# Patient Record
Sex: Male | Born: 1990 | Race: White | Hispanic: No | Marital: Single | Smoking: Current every day smoker
Health system: Southern US, Community
[De-identification: ages and names within clinical notes are randomized; demographics above are authoritative.]

## PROBLEM LIST (undated history)

## (undated) DIAGNOSIS — Z789 Other specified health status: Secondary | ICD-10-CM

## (undated) HISTORY — PX: TYMPANOSTOMY TUBE PLACEMENT: SHX32

---
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
## 2011-05-29 ENCOUNTER — Encounter: Payer: Self-pay | Admitting: Family Medicine

## 2011-05-29 ENCOUNTER — Ambulatory Visit (INDEPENDENT_AMBULATORY_CARE_PROVIDER_SITE_OTHER): Payer: Self-pay | Admitting: Family Medicine

## 2011-05-29 VITALS — BP 140/100 | HR 60 | Wt 145.0 lb

## 2011-05-29 DIAGNOSIS — N342 Other urethritis: Secondary | ICD-10-CM

## 2011-05-29 MED ORDER — DOXYCYCLINE HYCLATE 100 MG PO TABS: 100.0000 mg | ORAL_TABLET | Freq: Two times a day (BID) | ORAL | Status: AC

## 2011-05-29 NOTE — Progress Notes (Signed)
  Subjective:    Patient ID: Curtis Ross, male    DOB: 1991/01/04, 21 y.o.   MRN: [id number]  HPI He complains of a weeklong history of difficulty with dysuria but no discharge.. Recently his girlfriend was diagnosed with Chlamydia.   Review of Systems     Objective:   Physical Exam Alert and in no distress otherwise not examined       Assessment & Plan:   1. Urethritis  GC/chlamydia probe amp, urine, doxycycline (VIBRA-TABS) 100 MG tablet   condoms were given. I will call with the results

## 2011-05-31 LAB — GC/CHLAMYDIA PROBE AMP, URINE
Chlamydia, Swab/Urine, PCR: POSITIVE — AB
GC Probe Amp, Urine: NEGATIVE

## 2011-06-14 ENCOUNTER — Emergency Department (HOSPITAL_COMMUNITY)
Admission: EM | Admit: 2011-06-14 | Discharge: 2011-06-14 | Disposition: A | Discharge status: Home / Self Care | Payer: Self-pay | Attending: Emergency Medicine | Admitting: Emergency Medicine

## 2011-06-14 ENCOUNTER — Encounter (HOSPITAL_COMMUNITY): Payer: Self-pay | Admitting: Emergency Medicine

## 2011-06-14 DIAGNOSIS — S0100XA Unspecified open wound of scalp, initial encounter: Secondary | ICD-10-CM | POA: Insufficient documentation

## 2011-06-14 DIAGNOSIS — F10929 Alcohol use, unspecified with intoxication, unspecified: Secondary | ICD-10-CM

## 2011-06-14 DIAGNOSIS — W010XXA Fall on same level from slipping, tripping and stumbling without subsequent striking against object, initial encounter: Secondary | ICD-10-CM | POA: Insufficient documentation

## 2011-06-14 DIAGNOSIS — S0101XA Laceration without foreign body of scalp, initial encounter: Secondary | ICD-10-CM

## 2011-06-14 DIAGNOSIS — F17200 Nicotine dependence, unspecified, uncomplicated: Secondary | ICD-10-CM | POA: Insufficient documentation

## 2011-06-14 DIAGNOSIS — F1010 Alcohol abuse, uncomplicated: Secondary | ICD-10-CM | POA: Insufficient documentation

## 2011-06-14 MED ORDER — TETANUS-DIPHTH-ACELL PERTUSSIS 5-2.5-18.5 LF-MCG/0.5 IM SUSP
0.5000 mL | Freq: Once | INTRAMUSCULAR | Status: AC
  Administered 2011-06-14: 0.5 mL via INTRAMUSCULAR

## 2011-06-14 MED ORDER — TETANUS-DIPHTH-ACELL PERTUSSIS 5-2.5-18.5 LF-MCG/0.5 IM SUSP
INTRAMUSCULAR | Status: AC
  Administered 2011-06-14: 0.5 mL via INTRAMUSCULAR
  Filled 2011-06-14: qty 0.5

## 2011-06-14 NOTE — ED Provider Notes (Signed)
History     CSN: [id number]  Arrival date & time 06/14/11  0122   First MD Initiated Contact with Patient 06/14/11 0129      Chief Complaint  Patient presents with  . Fall    (Consider location/radiation/quality/duration/timing/severity/associated sxs/prior treatment) Patient is a 21 y.o. male presenting with fall. The history is provided by the patient and a parent.  Fall Pertinent negatives include no nausea, no vomiting and no headaches.   the patient is a 21 year old, male, who was drinking heavily tonight.  He fell and hit the back of his head and has a laceration to the back of his head.  He denies vomiting.  He denies a headache.  He denies any other injuries.  His last tetanus shot was within 5 years.  History reviewed. No pertinent past medical history.  History reviewed. No pertinent past surgical history.  History reviewed. No pertinent family history.  History  Substance Use Topics  . Smoking status: Current Everyday Smoker -- 1.0 packs/day  . Smokeless tobacco: Never Used  . Alcohol Use: Yes      Review of Systems  HENT: Negative for nosebleeds and neck pain.   Eyes: Negative for visual disturbance.  Gastrointestinal: Negative for nausea and vomiting.  Skin: Positive for wound.       Left occipital scalp laceration  Neurological: Negative for headaches.  Psychiatric/Behavioral: Negative for confusion.  All other systems reviewed and are negative.    Allergies  Review of patient's allergies indicates no known allergies.  Home Medications  No current outpatient prescriptions on file.  BP 148/88  Pulse 138  Temp(Src) 98.3 F (36.8 C) (Oral)  Resp 24  SpO2 97%  Physical Exam  Vitals reviewed. Constitutional: He is oriented to person, place, and time. He appears well-developed and well-nourished. No distress.       Obviously intoxicated  HENT:  Head: Normocephalic.       3 cm left occipital laceration, with active bleeding  Eyes: Conjunctivae  are normal. Pupils are equal, round, and reactive to light.  Neck: Normal range of motion. Neck supple.  Pulmonary/Chest: Effort normal.  Abdominal: He exhibits no distension.  Musculoskeletal: Normal range of motion. He exhibits no edema.  Neurological: He is alert and oriented to person, place, and time. No cranial nerve deficit.  Skin: Skin is warm and dry.  Psychiatric: He has a normal mood and affect. Thought content normal.    ED Course  Procedures (including critical care time) 21 year old, male, intoxicated with alcohol, presents with occipital laceration.  Although he is drunk.  He does not have any confused, speech or neurological deficits.  I do not think he has injured his neck or brain.  We will update his tetanus shot and close his wound with staples.  His mother is a Engineer, civil (consulting), and both she and her husband are comfortable taking the patient home without performing a CAT scan of his head.  Labs Reviewed - No data to display No results found.   1. Scalp laceration   2. Alcohol intoxication       MDM  Scalp laceration Alcohol intoxication Normal.  Mental status, however, and normal.  Neurological examination.  I doubt that the patient has a brain injury or neck injury.        Cheri Guppy, MD 06/14/11 [phone]
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
## 2011-06-14 NOTE — ED Notes (Signed)
Per pt's mother, pt has history of ETOH, marijuana and cocaine abuse, she is unsure if still using drugs, but pt is supposed to be in outpatient program

## 2011-06-14 NOTE — ED Notes (Signed)
Pt here with etoh on board, has lac to back of head and multiple abrasions noted to back from fall this am while with friends.

## 2011-06-14 NOTE — Discharge Instructions (Signed)
Do not drink excessive amounts of alcohol.  Followup with your Dr. in 5 days for staple removal.  Return for confusion.  Persistent vomiting or uncontrolled pain.

## 2012-04-26 ENCOUNTER — Ambulatory Visit (HOSPITAL_COMMUNITY)
Admission: RE | Admit: 2012-04-26 | Discharge: 2012-04-26 | Disposition: A | Discharge status: Home / Self Care | Payer: Self-pay | Attending: Internal Medicine | Admitting: Internal Medicine

## 2012-04-26 ENCOUNTER — Emergency Department (HOSPITAL_COMMUNITY)
Admission: EM | Admit: 2012-04-26 | Discharge: 2012-04-26 | Payer: Self-pay | Attending: Emergency Medicine | Admitting: Emergency Medicine

## 2012-04-26 ENCOUNTER — Encounter (HOSPITAL_COMMUNITY): Payer: Self-pay | Admitting: Emergency Medicine

## 2012-04-26 DIAGNOSIS — F1910 Other psychoactive substance abuse, uncomplicated: Secondary | ICD-10-CM | POA: Insufficient documentation

## 2012-04-26 DIAGNOSIS — F17200 Nicotine dependence, unspecified, uncomplicated: Secondary | ICD-10-CM | POA: Insufficient documentation

## 2012-04-26 DIAGNOSIS — F1210 Cannabis abuse, uncomplicated: Secondary | ICD-10-CM | POA: Insufficient documentation

## 2012-04-26 DIAGNOSIS — F1410 Cocaine abuse, uncomplicated: Secondary | ICD-10-CM | POA: Insufficient documentation

## 2012-04-26 DIAGNOSIS — F1010 Alcohol abuse, uncomplicated: Secondary | ICD-10-CM | POA: Insufficient documentation

## 2012-04-26 LAB — COMPREHENSIVE METABOLIC PANEL
ALT: 18 U/L (ref 0–53)
AST: 38 U/L — ABNORMAL HIGH (ref 0–37)
Albumin: 4.6 g/dL (ref 3.5–5.2)
Alkaline Phosphatase: 67 U/L (ref 39–117)
BUN: 10 mg/dL (ref 6–23)
CO2: 27 mEq/L (ref 19–32)
Calcium: 9.1 mg/dL (ref 8.4–10.5)
Chloride: 102 mEq/L (ref 96–112)
Creatinine, Ser: 0.85 mg/dL (ref 0.50–1.35)
GFR calc Af Amer: >90 mL/min (ref >90)
GFR calc non Af Amer: >90 mL/min (ref >90)
Glucose, Bld: 109 mg/dL — ABNORMAL HIGH (ref 70–99)
Potassium: 4 mEq/L (ref 3.5–5.1)
Sodium: 141 mEq/L (ref 135–145)
Total Bilirubin: 0.5 mg/dL (ref 0.3–1.2)
Total Protein: 8 g/dL (ref 6.0–8.3)

## 2012-04-26 LAB — RAPID URINE DRUG SCREEN, HOSP PERFORMED
Amphetamines: NOT DETECTED
Barbiturates: NOT DETECTED
Benzodiazepines: NOT DETECTED
Cocaine: POSITIVE — AB
Opiates: NOT DETECTED
Tetrahydrocannabinol: POSITIVE — AB

## 2012-04-26 LAB — CBC
MCH: 32.6 pg (ref 26.0–34.0)
MCV: 91.1 fL (ref 78.0–100.0)
Platelets: 249 10*3/uL (ref 150–400)
RBC: 4.94 MIL/uL (ref 4.22–5.81)
RDW: 11.9 % (ref 11.5–15.5)

## 2012-04-26 LAB — SALICYLATE LEVEL: Salicylate Lvl: <2 mg/dL — ABNORMAL LOW (ref 2.8–20.0)

## 2012-04-26 MED ORDER — LORAZEPAM 1 MG PO TABS: 1.0000 mg | ORAL_TABLET | Freq: Three times a day (TID) | ORAL | Status: DC | PRN

## 2012-04-26 MED ORDER — LORAZEPAM 1 MG PO TABS: 0.0000 mg | ORAL_TABLET | Freq: Two times a day (BID) | ORAL | Status: DC

## 2012-04-26 MED ORDER — THIAMINE HCL 100 MG/ML IJ SOLN
100.0000 mg | Freq: Every day | INTRAMUSCULAR | Status: DC
Start: 2012-04-26 — End: 2012-04-26

## 2012-04-26 MED ORDER — NICOTINE 21 MG/24HR TD PT24
21.0000 mg | MEDICATED_PATCH | Freq: Every day | TRANSDERMAL | Status: DC
  Filled 2012-04-26: qty 1

## 2012-04-26 MED ORDER — ONDANSETRON HCL 4 MG PO TABS: 4.0000 mg | ORAL_TABLET | Freq: Three times a day (TID) | ORAL | Status: DC | PRN

## 2012-04-26 MED ORDER — IBUPROFEN 600 MG PO TABS: 600.0000 mg | ORAL_TABLET | Freq: Three times a day (TID) | ORAL | Status: DC | PRN

## 2012-04-26 MED ORDER — LORAZEPAM 2 MG/ML IJ SOLN: 1.0000 mg | Freq: Four times a day (QID) | INTRAMUSCULAR | Status: DC | PRN

## 2012-04-26 MED ORDER — ADULT MULTIVITAMIN W/MINERALS CH
1.0000 | ORAL_TABLET | Freq: Every day | ORAL | Status: DC
  Administered 2012-04-26: 1 via ORAL
  Filled 2012-04-26: qty 1

## 2012-04-26 MED ORDER — ACETAMINOPHEN 325 MG PO TABS: 650.0000 mg | ORAL_TABLET | ORAL | Status: DC | PRN

## 2012-04-26 MED ORDER — FOLIC ACID 1 MG PO TABS
1.0000 mg | ORAL_TABLET | Freq: Every day | ORAL | Status: DC
  Administered 2012-04-26: 1 mg via ORAL
  Filled 2012-04-26: qty 1

## 2012-04-26 MED ORDER — LORAZEPAM 1 MG PO TABS: 1.0000 mg | ORAL_TABLET | Freq: Four times a day (QID) | ORAL | Status: DC | PRN

## 2012-04-26 MED ORDER — LORAZEPAM 1 MG PO TABS: 0.0000 mg | ORAL_TABLET | Freq: Four times a day (QID) | ORAL | Status: DC

## 2012-04-26 MED ORDER — VITAMIN B-1 100 MG PO TABS
100.0000 mg | ORAL_TABLET | Freq: Every day | ORAL | Status: DC
  Administered 2012-04-26: 100 mg via ORAL
  Filled 2012-04-26: qty 1

## 2012-04-26 NOTE — ED Notes (Signed)
Pt requesting detox from Etoh, cocaine and THC.

## 2012-04-26 NOTE — ED Provider Notes (Signed)
Medical screening examination/treatment/procedure(s) were conducted as a shared visit with non-physician practitioner(s) and myself.  I personally evaluated the patient during the encounter.  Patient desires alcohol toxic. He is not psychotic. ACT consult pending  Donnetta Hutching, MD 04/26/12 2354

## 2012-04-26 NOTE — ED Notes (Signed)
Pt arrived to room; pt appearing intoxicated and drowsy. Pt cooperative but speaking loudly and pressured.

## 2012-04-26 NOTE — ED Provider Notes (Signed)
1315.  Pt has been accepted for eval at [facility].  He will be discharged from the ER to follow-up.  Tobin Chad, MD 04/26/12 1334

## 2012-04-26 NOTE — BH Assessment (Signed)
Assessment Note   Curtis Ross is an 21 y.o. male. 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 Patient lives at home with both parents and is currently in a drug deterrent program ( [address] [place]) for the past year. He is represented in the program by a Gillermo Murdoch; [phone] and is unsure when his next court date will be. Patient states that he also sees Barbaraann Barthel at [facility] [place]. Patient endorses the desire for treatment and need for help with his substance problems. He denies any current or history of suicidality, homicidality, or auditory or visual hallucinations. He denies any history of seizures but endorses history of blackouts. Patient has been sent to [facility] for medical clearance and placement into a detox facility.  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 Axis IV: other psychosocial or environmental problems and problems related to social environment Axis V: 55  Past Medical History: No past medical history on file.  No past surgical history on file.  Family History: No family history on file.  Social History:  reports that he has been smoking.  He has never used smokeless tobacco. He reports that  drinks alcohol. He reports that he uses illicit drugs (Marijuana and Cocaine).  Additional Social History:  Alcohol / Drug Use History of alcohol / drug use?: Yes Substance #1 Name of Substance 1: Alcohol 1 - Age of First Use: 14 1 - Amount (size/oz): 1/5th liquor or at least a beer daily 1 - Frequency: Daily 1 - Duration: 2 years 1 - Last Use / Amount: 04/26/2012; 12 pack beek Substance #2 Name of Substance 2: Cocaine powder 2 - Age of First Use: 18 2 - Amount (size/oz): 1 gram 2 - Frequency: 1x/ month 2 - Duration: 2 years 2 - Last Use / Amount: 04/26/2012; .2  grams Substance #3 Name of Substance 3: THC 3 - Age of First Use: 16 3 - Amount (size/oz): 1 joint 3 - Frequency: Daily 3 - Duration: 2 years 3 - Last Use / Amount: 04/27/2011; 1 joint  CIWA:   COWS:    Allergies: No Known Allergies  Home Medications:  (Not in a hospital admission)  OB/GYN Status:  No LMP for male patient.  General Assessment Data Location of Assessment: [facility] Assessment Services Living Arrangements: Parent (Both parents) Can pt return to current living arrangement?: Yes Admission Status: Voluntary Is patient capable of signing voluntary admission?: Yes Transfer from: Home Referral Source: Self/Family/Friend  Education Status Is patient currently in school?: No Current Grade:  (Na) Highest grade of school patient has completed:  (12th) Contact person:  Olegario Messier Pallett/ mother)  Risk to self Suicidal Ideation: No Suicidal Intent: No Is patient at risk for suicide?: No Suicidal Plan?: No Access to Means: No What has been your use of drugs/alcohol within the last 12 months?:  (Daily) Previous Attempts/Gestures: No How many times?:  (Na) Other Self Harm Risks:  (Na) Triggers for Past Attempts: None known Intentional Self Injurious Behavior: None Family Suicide History: No Recent stressful life event(s):  (None noted) Persecutory voices/beliefs?: No Depression: No Depression Symptoms:  (None noted) Substance abuse history and/or treatment for substance abuse?: No Suicide prevention information given to non-admitted patients: Yes  Risk to Others Homicidal Ideation: No Thoughts of Harm to Others: No Current Homicidal Intent: No Current Homicidal Plan:  No Access to Homicidal Means: No Identified Victim:  (Na) History of harm to others?: No Assessment of Violence: None Noted Violent Behavior Description:  (Na) Does patient have access to weapons?: No Criminal Charges Pending?: No Does patient have a court date: No  Psychosis Hallucinations:  None noted Delusions: None noted  Mental Status Report Appear/Hygiene: Disheveled;Body odor;Poor hygiene Eye Contact: Fair Motor Activity: Psychomotor retardation Speech: Logical/coherent;Slurred Level of Consciousness: Drowsy Mood: Ambivalent (Intoxicated) Affect: Appropriate to circumstance Anxiety Level: None Thought Processes: Coherent;Relevant Judgement: Unimpaired Orientation: Person;Place;Time;Situation Obsessive Compulsive Thoughts/Behaviors: None  Cognitive Functioning Concentration: Decreased Memory: Recent Intact;Remote Intact IQ: Average Insight: Fair Impulse Control: Fair Appetite: Good Weight Loss:  (None noted) Weight Gain:  (None noted) Sleep: No Change Total Hours of Sleep:  (5 hours/ night) Vegetative Symptoms: None  ADLScreening [facility] Assessment Services) Patient's cognitive ability adequate to safely complete daily activities?: Yes Patient able to express need for assistance with ADLs?: Yes Independently performs ADLs?: Yes (appropriate for developmental age)  Abuse/Neglect [facility]) Physical Abuse: Denies Verbal Abuse: Denies Sexual Abuse: Denies  Prior Inpatient Therapy Prior Inpatient Therapy: No  Prior Outpatient Therapy Prior Outpatient Therapy: Yes Prior Therapy Dates:  (Current) Prior Therapy Facilty/Provider(s):  (Drug deterant program)  ADL Screening (condition at time of admission) Patient's cognitive ability adequate to safely complete daily activities?: Yes Patient able to express need for assistance with ADLs?: Yes Independently performs ADLs?: Yes (appropriate for developmental age) Weakness of Legs: None Weakness of Arms/Hands: None       Abuse/Neglect Assessment (Assessment to be complete while patient is alone) Physical Abuse: Denies Verbal Abuse: Denies Sexual Abuse: Denies Exploitation of patient/patient's resources: Denies Self-Neglect: Denies     Merchant navy officer (For Healthcare) Advance Directive: Patient does  not have advance directive;Patient would not like information    Additional Information 1:1 In Past 12 Months?: No CIRT Risk: No Elopement Risk: No Does patient have medical clearance?: No     Disposition:  Disposition Initial Assessment Completed: Yes Disposition of Patient: Other dispositions Other disposition(s):  (Sent to [facility] for medical clearance and placement)  On Site Evaluation by:   Reviewed with Physician:  Dr. Samuel Bouche, Patsy Lager M 04/26/2012 4:47 AM

## 2012-04-26 NOTE — ED Provider Notes (Signed)
History     CSN: [id number]  Arrival date & time 04/26/12  0510   First MD Initiated Contact with Patient 04/26/12 [phone]      Chief Complaint  Patient presents with  . Medical Clearance    (Consider location/radiation/quality/duration/timing/severity/associated sxs/prior treatment) HPI Comments: Patient presents today requesting detox from alcohol.  He reports that he drinks alcohol daily.  He typically drinks 1/5 of liquor daily.  Last drink was approximately 3 hours prior to arrival in the ED.  He reports that he would like to go to an inpatient treatment program for alcohol.  He has never gone through detox before.  No history of DT's.  He denies having any physical symptoms at this time.  No nausea, vomiting, tremors, confusion, or hallucinations.  He also uses cocaine and marijuana regularly.  He denies any SI or HI.    The history is provided by the patient.    History reviewed. No pertinent past medical history.  Past Surgical History  Procedure Laterality Date  . Tympanostomy tube placement      No family history on file.  History  Substance Use Topics  . Smoking status: Current Every Day Smoker -- 1.00 packs/day  . Smokeless tobacco: Never Used  . Alcohol Use: Yes     Comment: binge drinks      Review of Systems  Gastrointestinal: Negative for nausea and vomiting.  Neurological: Negative for dizziness, tremors, seizures, syncope, light-headedness and headaches.  Psychiatric/Behavioral: Negative for suicidal ideas, hallucinations and confusion.  All other systems reviewed and are negative.    Allergies  Review of patient's allergies indicates no known allergies.  Home Medications  No current outpatient prescriptions on file.  BP 121/81  Pulse 94  Temp(Src) 98.2 F (36.8 C) (Oral)  Resp 18  Ht 5\' 11"  (1.803 m)  Wt 140 lb (63.504 kg)  BMI 19.53 kg/m2  SpO2 97%  Physical Exam  Nursing note and vitals reviewed. Constitutional: He is oriented to  person, place, and time. He appears well-developed and well-nourished. No distress.  HENT:  Head: Normocephalic and atraumatic.  Mouth/Throat: Oropharynx is clear and moist.  Eyes: EOM are normal. Pupils are equal, round, and reactive to light.  Neck: Normal range of motion. Neck supple.  Cardiovascular: Normal rate, regular rhythm and normal heart sounds.   Pulmonary/Chest: Effort normal and breath sounds normal. He has no wheezes. He has no rales.  Neurological: He is alert and oriented to person, place, and time.  Skin: Skin is warm and dry. He is not diaphoretic.  Psychiatric: He has a normal mood and affect. His speech is normal and behavior is normal. Thought content normal. He expresses no homicidal and no suicidal ideation. He expresses no suicidal plans and no homicidal plans.    ED Course  Procedures (including critical care time)  Labs Reviewed  CBC  ACETAMINOPHEN LEVEL  COMPREHENSIVE METABOLIC PANEL  ETHANOL  SALICYLATE LEVEL  URINE RAPID DRUG SCREEN (HOSP PERFORMED)   No results found.   No diagnosis found.  Patient discussed with Dr. Adriana Simas.  MDM  Patient presents today requesting detox from alcohol.  No history of DT's.  No SI or HI.  Labs today unremarkable.  ACT has been notified.  Psych holding orders have been placed.  CIWA orders have been placed.        Pascal Lux [place], PA-C 04/26/12 1753

## 2012-04-26 NOTE — ED Notes (Signed)
One bag in locker.

## 2014-09-28 ENCOUNTER — Ambulatory Visit (INDEPENDENT_AMBULATORY_CARE_PROVIDER_SITE_OTHER): Payer: Self-pay | Admitting: Family Medicine

## 2014-09-28 ENCOUNTER — Encounter: Payer: Self-pay | Admitting: Family Medicine

## 2014-09-28 VITALS — BP 130/90 | HR 101 | Ht 70.0 in | Wt 164.0 lb

## 2014-09-28 DIAGNOSIS — F329 Major depressive disorder, single episode, unspecified: Secondary | ICD-10-CM

## 2014-09-28 DIAGNOSIS — F32A Depression, unspecified: Secondary | ICD-10-CM

## 2014-09-28 MED ORDER — CITALOPRAM HYDROBROMIDE 20 MG PO TABS: 20.0000 mg | ORAL_TABLET | Freq: Every day | ORAL | Status: DC
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
## 2014-09-28 NOTE — Patient Instructions (Signed)
Call [facility] psychology clinic

## 2014-11-01 ENCOUNTER — Encounter: Payer: Self-pay | Admitting: Family Medicine

## 2014-11-01 ENCOUNTER — Ambulatory Visit (INDEPENDENT_AMBULATORY_CARE_PROVIDER_SITE_OTHER): Payer: Self-pay | Admitting: Family Medicine

## 2014-11-01 VITALS — BP 110/60 | HR 70 | Wt 167.0 lb

## 2014-11-01 DIAGNOSIS — F32A Depression, unspecified: Secondary | ICD-10-CM

## 2014-11-01 DIAGNOSIS — F329 Major depressive disorder, single episode, unspecified: Secondary | ICD-10-CM

## 2014-11-01 NOTE — Patient Instructions (Signed)
Call me in 2 weeks and let me know how you're doing. As long as you keep improving I will leave you at this level.

## 2014-11-01 NOTE — Progress Notes (Signed)
   Subjective:    Patient ID: Curtis Ross, male    DOB: 1991-02-08, 24 y.o.   MRN: [id number]  HPI He is here for a recheck. He states that he does feel much better having more energy and desire. He states that he feels roughly 75% better. He is in the process of being evaluated at the psychology clinic at [facility].   Review of Systems     Objective:   Physical Exam Alert and in no distress with appropriate affect and dressed appropriately.       Assessment & Plan:  Depression He is to call me in 2 weeks to let me know how he is doing. I might possibly increase his dictation to 40 mg if needed. Encouraged him to continue with counseling.

## 2014-11-21 ENCOUNTER — Other Ambulatory Visit: Payer: Self-pay | Admitting: Family Medicine

## 2014-11-21 MED ORDER — CITALOPRAM HYDROBROMIDE 40 MG PO TABS: 40.0000 mg | ORAL_TABLET | Freq: Every day | ORAL | Status: DC

## 2014-11-21 NOTE — Progress Notes (Signed)
He called stating that the 20 mg tablet has a roughly 75% better. I will increase this to 40 mg and he will call me in one month.

## 2014-11-28 ENCOUNTER — Telehealth: Payer: Self-pay | Admitting: Family Medicine

## 2014-11-28 NOTE — Telephone Encounter (Signed)
Mom called said pt doing better, starts counseling at [facility] 12/06/14 & he will call back around 27th & let you know how he's doing

## 2015-04-03 ENCOUNTER — Other Ambulatory Visit: Payer: Self-pay | Admitting: Family Medicine

## 2015-05-06 ENCOUNTER — Other Ambulatory Visit: Payer: Self-pay | Admitting: Family Medicine

## 2015-05-07 NOTE — Telephone Encounter (Signed)
Have him schedule a follow-up appointment.

## 2015-05-07 NOTE — Telephone Encounter (Signed)
Is this okay to refill? 

## 2015-05-26 ENCOUNTER — Emergency Department (HOSPITAL_COMMUNITY)
Admission: EM | Admit: 2015-05-26 | Discharge: 2015-05-26 | Disposition: A | Discharge status: Other Acute Inpatient Hospital | Payer: Self-pay | Attending: Emergency Medicine | Admitting: Emergency Medicine

## 2015-05-26 ENCOUNTER — Encounter (HOSPITAL_COMMUNITY): Payer: Self-pay | Admitting: *Deleted

## 2015-05-26 ENCOUNTER — Inpatient Hospital Stay (HOSPITAL_COMMUNITY)
Admission: AD | Admit: 2015-05-26 | Discharge: 2015-05-28 | DRG: 897 | Disposition: A | Discharge status: Home / Self Care | Payer: Federal, State, Local not specified - Other | Source: Intra-hospital | Attending: Psychiatry | Admitting: Psychiatry

## 2015-05-26 ENCOUNTER — Encounter (HOSPITAL_COMMUNITY): Payer: Self-pay | Admitting: Emergency Medicine

## 2015-05-26 DIAGNOSIS — Y908 Blood alcohol level of 240 mg/100 ml or more: Secondary | ICD-10-CM | POA: Diagnosis present

## 2015-05-26 DIAGNOSIS — F17200 Nicotine dependence, unspecified, uncomplicated: Secondary | ICD-10-CM | POA: Insufficient documentation

## 2015-05-26 DIAGNOSIS — F10229 Alcohol dependence with intoxication, unspecified: Secondary | ICD-10-CM | POA: Diagnosis present

## 2015-05-26 DIAGNOSIS — R45851 Suicidal ideations: Secondary | ICD-10-CM | POA: Insufficient documentation

## 2015-05-26 DIAGNOSIS — F1020 Alcohol dependence, uncomplicated: Secondary | ICD-10-CM | POA: Diagnosis not present

## 2015-05-26 DIAGNOSIS — F1210 Cannabis abuse, uncomplicated: Secondary | ICD-10-CM | POA: Insufficient documentation

## 2015-05-26 DIAGNOSIS — F10920 Alcohol use, unspecified with intoxication, uncomplicated: Secondary | ICD-10-CM

## 2015-05-26 DIAGNOSIS — F411 Generalized anxiety disorder: Secondary | ICD-10-CM | POA: Diagnosis present

## 2015-05-26 DIAGNOSIS — F10120 Alcohol abuse with intoxication, uncomplicated: Secondary | ICD-10-CM | POA: Insufficient documentation

## 2015-05-26 DIAGNOSIS — F332 Major depressive disorder, recurrent severe without psychotic features: Secondary | ICD-10-CM | POA: Diagnosis not present

## 2015-05-26 DIAGNOSIS — Z79899 Other long term (current) drug therapy: Secondary | ICD-10-CM | POA: Insufficient documentation

## 2015-05-26 HISTORY — DX: Other specified health status: Z78.9

## 2015-05-26 LAB — CBC WITH DIFFERENTIAL/PLATELET
BASOS ABS: 0 10*3/uL (ref 0.0–0.1)
BASOS PCT: 0 %
EOS PCT: 1 %
Eosinophils Absolute: 0.1 10*3/uL (ref 0.0–0.7)
HCT: 46.2 % (ref 39.0–52.0)
Hemoglobin: 17 g/dL (ref 13.0–17.0)
Lymphocytes Relative: 52 %
Lymphs Abs: 5 10*3/uL — ABNORMAL HIGH (ref 0.7–4.0)
MCH: 32.4 pg (ref 26.0–34.0)
MCHC: 36.8 g/dL — ABNORMAL HIGH (ref 30.0–36.0)
MCV: 88.2 fL (ref 78.0–100.0)
MONO ABS: 0.8 10*3/uL (ref 0.1–1.0)
Monocytes Relative: 8 %
NEUTROS ABS: 3.9 10*3/uL (ref 1.7–7.7)
Neutrophils Relative %: 40 %
PLATELETS: 294 10*3/uL (ref 150–400)
RBC: 5.24 MIL/uL (ref 4.22–5.81)
RDW: 12.4 % (ref 11.5–15.5)
WBC: 9.7 10*3/uL (ref 4.0–10.5)

## 2015-05-26 LAB — RAPID URINE DRUG SCREEN, HOSP PERFORMED
AMPHETAMINES: NOT DETECTED
BENZODIAZEPINES: NOT DETECTED
Barbiturates: NOT DETECTED
COCAINE: NOT DETECTED
Opiates: NOT DETECTED
Tetrahydrocannabinol: POSITIVE — AB

## 2015-05-26 LAB — COMPREHENSIVE METABOLIC PANEL
ALBUMIN: 4.5 g/dL (ref 3.5–5.0)
ALT: 19 U/L (ref 17–63)
ANION GAP: 13 (ref 5–15)
AST: 16 U/L (ref 15–41)
Alkaline Phosphatase: 66 U/L (ref 38–126)
BUN: 7 mg/dL (ref 6–20)
CHLORIDE: 108 mmol/L (ref 101–111)
CO2: 23 mmol/L (ref 22–32)
Calcium: 8.8 mg/dL — ABNORMAL LOW (ref 8.9–10.3)
Creatinine, Ser: 0.96 mg/dL (ref 0.61–1.24)
GFR calc Af Amer: >60 mL/min (ref >60)
GFR calc non Af Amer: >60 mL/min (ref >60)
GLUCOSE: 99 mg/dL (ref 65–99)
POTASSIUM: 3.7 mmol/L (ref 3.5–5.1)
SODIUM: 144 mmol/L (ref 135–145)
Total Bilirubin: 0.5 mg/dL (ref 0.3–1.2)
Total Protein: 7.7 g/dL (ref 6.5–8.1)

## 2015-05-26 LAB — ETHANOL: ALCOHOL ETHYL (B): 306 mg/dL — AB (ref <5)

## 2015-05-26 MED ORDER — LORAZEPAM 1 MG PO TABS
1.0000 mg | ORAL_TABLET | Freq: Three times a day (TID) | ORAL | Status: DC | PRN
  Administered 2015-05-26: 1 mg via ORAL
  Filled 2015-05-26: qty 1

## 2015-05-26 MED ORDER — LORAZEPAM 1 MG PO TABS: 0.0000 mg | ORAL_TABLET | Freq: Two times a day (BID) | ORAL | Status: DC

## 2015-05-26 MED ORDER — ALUM & MAG HYDROXIDE-SIMETH 200-200-20 MG/5ML PO SUSP: 30.0000 mL | ORAL | Status: DC | PRN

## 2015-05-26 MED ORDER — LORAZEPAM 1 MG PO TABS: 0.0000 mg | ORAL_TABLET | Freq: Four times a day (QID) | ORAL | Status: DC

## 2015-05-26 MED ORDER — ONDANSETRON HCL 4 MG PO TABS: 4.0000 mg | ORAL_TABLET | Freq: Three times a day (TID) | ORAL | Status: DC | PRN

## 2015-05-26 MED ORDER — ZOLPIDEM TARTRATE 5 MG PO TABS
5.0000 mg | ORAL_TABLET | Freq: Every evening | ORAL | Status: DC | PRN
  Administered 2015-05-26: 5 mg via ORAL
  Filled 2015-05-26: qty 1
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
## 2015-05-26 NOTE — BH Assessment (Signed)
Attempted tele-assessment. Pt refused to talk to someone on camera. [facility] to assess Pt face-to-face.   Harlin RainFord Ellis Patsy BaltimoreWarrick Jr, LPC, [facility], [facility] Triage Specialist [phone]
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
## 2015-05-26 NOTE — Progress Notes (Signed)
Patient did not attend the evening speaker AA meeting. Pt was notified that group was beginning but remained in bed.   

## 2015-05-26 NOTE — ED Notes (Signed)
[facility] [place] [facility]

## 2015-05-26 NOTE — BH Assessment (Signed)
Assessment completed. Consulted Alberteen SamFran Hobson, NP who recommended inpatient treatment. TTS to seek placement. Informed Dr. Karma GanjaLinker of the recommendation.

## 2015-05-26 NOTE — ED Notes (Signed)
Patient pleasant at this time, reading a book. Denies needs or wants.

## 2015-05-26 NOTE — ED Notes (Signed)
Patient standing at door way of his room, asked patient go back into room, he winked and continued to stare at staff. Patient is agitated at this time.

## 2015-05-26 NOTE — Progress Notes (Signed)
Patient reports that he was having some self harm thoughts but denies SI/HI and A/V hallucinations, patient contracts for safety, patient reports that his stressors is that he broke up with his girlfriend about a week ago and school, patient is unemployed and lives with his parents, patient reports that he drinks 6-7 mixed drinks daily and he also use thc daily

## 2015-05-26 NOTE — Progress Notes (Signed)
[facility] per [place], AC.  Call report to [phone].     Maryelizabeth Rowanressa Alle Difabio, MSW, Clare CharonLCSW, LCAS [facility] Triage Specialist [phone]

## 2015-05-26 NOTE — ED Notes (Signed)
Patient is resting comfortably. 

## 2015-05-26 NOTE — ED Notes (Signed)
Patient agitated about staff looking at him. Refused to allow staff to do vital signs.  reassigned room to TCU 26.

## 2015-05-26 NOTE — ED Provider Notes (Signed)
CSN: [id number]     Arrival date & time 05/26/15  0124 History   By signing my name below, I, Curtis Ross, attest that this documentation has been prepared under the direction and in the presence of Curtis Scott, MD.  Electronically Signed: Arlan Ross, ED Scribe. 05/26/2015. 2:34 AM.   Chief Complaint  Patient presents with  . Medical Clearance  . Suicidal   The history is provided by the patient. No language interpreter was used.    LEVEL 5 CAVEAT- PT IS INTOXICATED  HPI Comments: Curtis Ross is a 25 y.o. male without any pertinent past medical history who presents to the Emergency Department here for medical clearance this evening. Pt admits to suicidal ideation x few years; worsened this evening. He states ideation worsened after consuming 3- 4 Locos tonight. Per triage note, plan includes shooting himself. Pt does have access to guns at home. Ongoing auditory hallucination also reported. No HI at this time. [name] currently takes Celexa daily.  PCP: Carollee Herter, MD    History reviewed. No pertinent past medical history. Past Surgical History  Procedure Laterality Date  . Tympanostomy tube placement     No family history on file. Social History  Substance Use Topics  . Smoking status: Current Every Day Smoker -- 1.00 packs/day  . Smokeless tobacco: Never Used  . Alcohol Use: Yes     Comment: binge drinks    Review of Systems  Unable to perform ROS: Other      Allergies  Review of patient's allergies indicates no known allergies.  Home Medications   Prior to Admission medications   Medication Sig Start Date End Date Taking? Authorizing Provider  citalopram (CELEXA) 40 MG tablet TAKE ONE TABLET BY MOUTH ONCE DAILY Patient taking differently: TAKE 40 MG BY MOUTH ONCE DAILY 05/07/15  Yes Curtis Nian, MD   Triage Vitals: BP 126/79 mmHg  Pulse 89  Temp(Src) 98 F (36.7 C)  Resp 16  Ht  (1.778 m)  Wt 167 lb (75.751 kg)  BMI 23.96  kg/m2  SpO2 100%  Vitals reviewed Physical Exam  Physical Examination: General appearance - alert, well appearing, and in no distress Mental status - alert, oriented to person, place, and time Eyes - no scleral icterus, no conjunctival injection Chest - clear to auscultation, no wheezes, rales or rhonchi, symmetric air entry Neurological - alert, oriented, normal speech Musculoskeletal - no joint tenderness, deformity or swelling Extremities - peripheral pulses normal, no pedal edema, no clubbing or cyanosis Skin - normal coloration and turgor, no rashes Psych- intoxicated appearing, calm and cooperative  ED Course  Procedures (including critical care time)  DIAGNOSTIC STUDIES: Oxygen Saturation is 100% on RA, Normal by my interpretation.    COORDINATION OF CARE: 1:41 AM- Will order blood work and urine drug screen. Discussed treatment plan with pt at bedside and pt agreed to plan.     Labs Review Labs Reviewed  COMPREHENSIVE METABOLIC PANEL - Abnormal; Notable for the following:    Calcium 8.8 (*)    All other components within normal limits  ETHANOL - Abnormal; Notable for the following:    Alcohol, Ethyl (B) 306 (*)    All other components within normal limits  CBC WITH DIFFERENTIAL/PLATELET - Abnormal; Notable for the following:    MCHC 36.8 (*)    Lymphs Abs 5.0 (*)    All other components within normal limits  URINE RAPID DRUG SCREEN, HOSP PERFORMED - Abnormal; Notable for the following:  Tetrahydrocannabinol POSITIVE (*)    All other components within normal limits    Imaging Review No results found. I have personally reviewed and evaluated these images and lab results as part of my medical decision-making.   EKG Interpretation None      MDM   Final diagnoses:  Suicidal ideation  Alcohol intoxication, uncomplicated (HCC)    Pt presenting with c/o suicidal ideations.  Pt is currently intoxicated with alcohol.  Psych holding orders written, pt is  medically cleared.  Will need evaluation by TTS.    I personally performed the services described in this documentation, which was scribed in my presence. The recorded information has been reviewed and is accurate.    4:36 AM- pt has been evaluated by TTS, they state he meets inpatient criteria and they are going to look for placement.    Curtis ScottMartha Linker, MD 05/26/15 [phone]
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
## 2015-05-26 NOTE — Tx Team (Signed)
Initial Interdisciplinary Treatment Plan   PATIENT STRESSORS: Substance abuse Traumatic event   PATIENT STRENGTHS: Average or above average intelligence Financial means General fund of knowledge Physical Health Supportive family/friends   PROBLEM LIST: Problem List/Patient Goals Date to be addressed Date deferred Reason deferred Estimated date of resolution                                                         DISCHARGE CRITERIA:  Improved stabilization in mood, thinking, and/or behavior Need for constant or close observation no longer present  PRELIMINARY DISCHARGE PLAN: Outpatient therapy Return to previous living arrangement Return to previous work or school arrangements  PATIENT/FAMIILY INVOLVEMENT: This treatment plan has been presented to and reviewed with the patient, Curtis Ross.  The patient and family have been given the opportunity to ask questions and make suggestions.  [name], Lorin Gawron M 05/26/2015, 7:11 PM

## 2015-05-26 NOTE — ED Notes (Signed)
Pt refused to participate in teleassessment. Pt states he wants a face to face assessment

## 2015-05-27 ENCOUNTER — Encounter (HOSPITAL_COMMUNITY): Payer: Self-pay | Admitting: Psychiatry

## 2015-05-27 DIAGNOSIS — F332 Major depressive disorder, recurrent severe without psychotic features: Secondary | ICD-10-CM

## 2015-05-27 DIAGNOSIS — F1020 Alcohol dependence, uncomplicated: Secondary | ICD-10-CM

## 2015-05-27 MED ORDER — TRAZODONE HCL 50 MG PO TABS
50.0000 mg | ORAL_TABLET | Freq: Every day | ORAL | Status: DC
  Administered 2015-05-27: 50 mg via ORAL
  Filled 2015-05-27: qty 1
  Filled 2015-05-27: qty 7
  Filled 2015-05-27 (×3): qty 1

## 2015-05-27 MED ORDER — THIAMINE HCL 100 MG/ML IJ SOLN
100.0000 mg | Freq: Once | INTRAMUSCULAR | Status: AC
  Administered 2015-05-27: 100 mg via INTRAMUSCULAR
  Filled 2015-05-27: qty 2

## 2015-05-27 MED ORDER — VITAMIN B-1 100 MG PO TABS
100.0000 mg | ORAL_TABLET | Freq: Every day | ORAL | Status: DC
  Administered 2015-05-28: 100 mg via ORAL
  Filled 2015-05-27 (×3): qty 1

## 2015-05-27 MED ORDER — CITALOPRAM HYDROBROMIDE 40 MG PO TABS
40.0000 mg | ORAL_TABLET | Freq: Every day | ORAL | Status: DC
  Administered 2015-05-27 – 2015-05-28 (×2): 40 mg via ORAL
  Filled 2015-05-27: qty 7
  Filled 2015-05-27: qty 2
  Filled 2015-05-27 (×4): qty 1
  Filled 2015-05-27: qty 2

## 2015-05-27 MED ORDER — ONDANSETRON 4 MG PO TBDP: 4.0000 mg | ORAL_TABLET | Freq: Four times a day (QID) | ORAL | Status: DC | PRN

## 2015-05-27 MED ORDER — CHLORDIAZEPOXIDE HCL 25 MG PO CAPS: 25.0000 mg | ORAL_CAPSULE | Freq: Four times a day (QID) | ORAL | Status: DC | PRN

## 2015-05-27 MED ORDER — CHLORDIAZEPOXIDE HCL 25 MG PO CAPS
25.0000 mg | ORAL_CAPSULE | Freq: Three times a day (TID) | ORAL | Status: DC
  Administered 2015-05-28: 25 mg via ORAL
  Filled 2015-05-27: qty 1

## 2015-05-27 MED ORDER — HYDROXYZINE HCL 25 MG PO TABS
25.0000 mg | ORAL_TABLET | Freq: Four times a day (QID) | ORAL | Status: DC | PRN
  Filled 2015-05-27: qty 10

## 2015-05-27 MED ORDER — LOPERAMIDE HCL 2 MG PO CAPS: 2.0000 mg | ORAL_CAPSULE | ORAL | Status: DC | PRN

## 2015-05-27 MED ORDER — ADULT MULTIVITAMIN W/MINERALS CH
1.0000 | ORAL_TABLET | Freq: Every day | ORAL | Status: DC
  Administered 2015-05-27 – 2015-05-28 (×2): 1 via ORAL
  Filled 2015-05-27 (×5): qty 1

## 2015-05-27 MED ORDER — CHLORDIAZEPOXIDE HCL 25 MG PO CAPS
25.0000 mg | ORAL_CAPSULE | Freq: Four times a day (QID) | ORAL | Status: AC
Start: 2015-05-27 — End: 2015-05-27
  Administered 2015-05-27 (×3): 25 mg via ORAL
  Filled 2015-05-27 (×3): qty 1

## 2015-05-27 MED ORDER — CHLORDIAZEPOXIDE HCL 25 MG PO CAPS: 25.0000 mg | ORAL_CAPSULE | ORAL | Status: DC

## 2015-05-27 MED ORDER — CHLORDIAZEPOXIDE HCL 25 MG PO CAPS: 25.0000 mg | ORAL_CAPSULE | Freq: Every day | ORAL | Status: DC

## 2015-05-27 NOTE — Progress Notes (Signed)
Patient ID: Corena PilgrimBenjamin L Rampersad, male   DOB: 30-Jan-1991, 25 y.o.   MRN: [id number]  Adult Psychoeducational Group Note  Date:  05/27/2015 Time:  1:45pm   Group Topic/Focus:  Identifying Needs:   The focus of this group is to help patients identify their personal needs that have been historically problematic and identify healthy behaviors to address their needs.  Participation Level:  Did Not Attend  Participation Quality: n/a  Affect: n/a  Cognitive: n/a  Insight:n/a  Engagement in Group:  n/a  Modes of Intervention:  Activity, Discussion, Education and Support  Additional Comments:  Pt chose not to attend group.   Aurora Maskwyman, Heylee Tant E 05/27/2015, 2:21 PM

## 2015-05-27 NOTE — Progress Notes (Signed)
Patient did attend the evening speaker AA meeting.  
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
## 2015-05-27 NOTE — Progress Notes (Addendum)
Patient ID: Curtis PilgrimBenjamin L Kindt, male   DOB: 1990-09-11, 25 y.o.   MRN: [id number]   Pt currently presents with a flat affect and cooperative behavior. Per self inventory, pt rates depression at a 3, hopelessness 2 and anxiety 2. Pt's daily goal is to "trying to lift my depression a bit" and they intend to do so by "group." Pt reports good sleep, a fair appetite, normal energy and good concentration. Pt forwards little to Clinical research associatewriter. Complaints of mild nausea from withdrawal, obvious sweating.   Pt provided with medications per providers orders. Pt's labs and vitals were monitored throughout the day. Pt supported emotionally and encouraged to express concerns and questions. Pt educated on medications. Pt refuses prn medications.   Pt's safety ensured with 15 minute and environmental checks. Pt currently denies SI/HI and A/V hallucinations. Pt verbally agrees to seek staff if SI/HI or A/VH occurs and to consult with staff before acting on these thoughts. Pt affect brighter this afternoon. Will continue POC.

## 2015-05-27 NOTE — Progress Notes (Addendum)
D: Patient observed in room in bed. Patient states he is "depressed and wants to work on his depression." Patient with flat affect. Support offered. A: Support and encouragement offered. Q 15 minute checks in progress and maintained for safety. R: Patient remains safe on unit.

## 2015-05-27 NOTE — [facility] Group Notes (Signed)
[facility] Group Notes: (Clinical Social Work)   05/27/2015      Type of Therapy:  Group Therapy   Participation Level:  Did Not Attend despite MHT prompting   Ambrose MantleMareida Grossman-Orr, LCSW 05/27/2015, 12:26 PM

## 2015-05-27 NOTE — [facility] Suicide Risk Assessment (Signed)
[facility]   Nursing information obtained from:  Patient Demographic factors:  Male Current Mental Status:  Self-harm thoughts Loss Factors:  NA Historical Factors:  NA Risk Reduction Factors:  Sense of responsibility to family, Positive social support  Total Time spent with patient: 30 minutes Principal Problem: Alcohol use disorder, severe, dependence ([facility]) Diagnosis:   Patient Active Problem List   Diagnosis Date Noted  . Alcohol use disorder, severe, dependence ([facility]) [F10.20] 05/26/2015   Subjective Data: Pt states " I am anxious , I need help.'   Continued Clinical Symptoms:  Alcohol Use Disorder Identification Test Final Score (AUDIT): 10 The "Alcohol Use Disorders Identification Test", Guidelines for Use in Primary Care, Second Edition.  World Science writerHealth Organization [facility](WHO). Score between 0-7:  no or low risk or alcohol related problems. Score between 8-15:  moderate risk of alcohol related problems. Score between 16-19:  high risk of alcohol related problems. Score 20 or above:  warrants further diagnostic evaluation for alcohol dependence and treatment.   CLINICAL FACTORS:   Alcohol/Substance Abuse/Dependencies   Musculoskeletal: Strength & Muscle Tone: within normal limits Gait & Station: normal Patient leans: N/A  Psychiatric Specialty Exam: Review of Systems  Psychiatric/Behavioral: Positive for depression and substance abuse. The patient is nervous/anxious.   All other systems reviewed and are negative.   Blood pressure 141/90, pulse 62, temperature 98.4 F (36.9 C), temperature source Oral, resp. rate 17, height 5\' 10"  (1.778 m), weight 81.647 kg (180 lb), SpO2 97 %.Body mass index is 25.83 kg/(m^2).  General Appearance: Disheveled  Eye SolicitorContact::  Fair  Speech:  Clear and Coherent  Volume:  Decreased  Mood:  Anxious and Depressed  Affect:  Congruent  Thought Process:  Coherent  Orientation:  Full (Time, Place, and Person)   Thought Content:  Rumination  Suicidal Thoughts:  No  Homicidal Thoughts:  No  Memory:  Immediate;   Fair Recent;   Fair Remote;   Fair  Judgement:  Impaired  Insight:  Fair  Psychomotor Activity:  Restlessness  Concentration:  Poor  Recall:  FiservFair  Fund of Knowledge:Fair  Language: Fair  Akathisia:  No  Handed:  Right  AIMS (if indicated):     Assets:  Desire for Improvement  Sleep:  Number of Hours: 5.25  Cognition: WNL  ADL's:  Intact    COGNITIVE FEATURES THAT CONTRIBUTE TO RISK:  Closed-mindedness, Polarized thinking and Thought constriction (tunnel vision)    SUICIDE RISK:   Mild:  Suicidal ideation of limited frequency, intensity, duration, and specificity.  There are no identifiable plans, no associated intent, mild dysphoria and related symptoms, good self-control (both objective and subjective assessment), few other risk factors, and identifiable protective factors, including available and accessible social support.  PLAN OF CARE: Patient will benefit from inpatient treatment and stabilization.  Estimated length of stay is 5-7 days.  Reviewed past medical records,treatment plan.  Case discussed with Aggie NP - pls see H&P. CSW will start working on disposition.  Patient to participate in therapeutic milieu .       I certify that inpatient services furnished can reasonably be expected to improve the patient's condition.   Charlott Calvario, MD 05/27/2015, 1:35 PM

## 2015-05-28 DIAGNOSIS — F411 Generalized anxiety disorder: Secondary | ICD-10-CM | POA: Diagnosis present

## 2015-05-28 LAB — TSH: TSH: 0.721 u[IU]/mL (ref 0.350–4.500)

## 2015-05-28 MED ORDER — NICOTINE 21 MG/24HR TD PT24
21.0000 mg | MEDICATED_PATCH | Freq: Every day | TRANSDERMAL | Status: DC
  Filled 2015-05-28 (×3): qty 1

## 2015-05-28 MED ORDER — CITALOPRAM HYDROBROMIDE 40 MG PO TABS: 40.0000 mg | ORAL_TABLET | Freq: Every day | ORAL | Status: DC

## 2015-05-28 MED ORDER — TRAZODONE HCL 50 MG PO TABS: 50.0000 mg | ORAL_TABLET | Freq: Every day | ORAL | Status: DC

## 2015-05-28 MED ORDER — HYDROXYZINE HCL 25 MG PO TABS: 25.0000 mg | ORAL_TABLET | Freq: Four times a day (QID) | ORAL | Status: DC | PRN

## 2015-05-28 MED ORDER — NICOTINE 21 MG/24HR TD PT24: 21.0000 mg | MEDICATED_PATCH | Freq: Every day | TRANSDERMAL | Status: DC

## 2015-05-28 NOTE — Plan of Care (Signed)
Problem: Alteration in mood & ability to function due to Goal: LTG-Pt reports reduction in suicidal thoughts (Patient reports reduction in suicidal thoughts and is able to verbalize a safety plan for whenever patient is feeling suicidal)  Outcome: Progressing Pt denies SI and verbally contracts for safety.       

## 2015-05-28 NOTE — Progress Notes (Addendum)
Patient ID: Corena PilgrimBenjamin L Carpino, male   DOB: Jul 12, 1990, 25 y.o.   MRN: [id number]  Pt currently presents with a flat affect and depressed behavior. Pt affect brighter today, pt attends groups this morning. Pt reports that he is feeling better today. Per self inventory, pt rates depression at a2, hopelessness 1 and anxiety 1. Pt's daily goal is to "find support in AA" and they intend to do so by "go to AA." Pt reports good sleep, a good appetite, normal energy and good concentration.    Pt provided with medications per providers orders. Pt's labs and vitals were monitored throughout the day. Pt supported emotionally and encouraged to express concerns and questions. Pt educated on medications and suicide prevention resources.   Pt's safety ensured with 15 minute and environmental checks. Pt currently denies SI/HI and A/V hallucinations. Pt verbally agrees to seek staff if SI/HI or A/VH occurs and to consult with staff before acting on these thoughts. Pt to be discharged per MD. Will continue POC.

## 2015-05-28 NOTE — Discharge Summary (Signed)
Physician Discharge Summary Note  Patient:  Curtis Ross is an 25 y.o., male MRN:  [id number] DOB:  1991-02-18 Patient phone:  [phone] (home)  Patient address:   [address] [place] [address],  Total Time spent with patient: 30 minutes  Date of Admission:  05/26/2015 Date of Discharge: 05/28/2015  Reason for Admission:  Substance abuse  Principal Problem: Alcohol use disorder, severe, dependence [facility]) Discharge Diagnoses: Patient Active Problem List   Diagnosis Date Noted  . GAD (generalized anxiety disorder) [F41.1] 05/28/2015  . Alcohol use disorder, severe, dependence (HCC) [F10.20] 05/26/2015    Past Psychiatric History:  See above noted  Past Medical History:  Past Medical History  Diagnosis Date  . Medical history non-contributory     Past Surgical History  Procedure Laterality Date  . Tympanostomy tube placement     Family History: History reviewed. No pertinent family history. Family Psychiatric  History:  Denied Social History:  History  Alcohol Use  . Yes    Comment: binge drinks     History  Drug Use  . Yes  . Special: Marijuana, Cocaine    Comment: used tonight    Social History   Social History  . Marital Status: Single    Spouse Name: N/A  . Number of Children: N/A  . Years of Education: N/A   Social History Main Topics  . Smoking status: Current Every Day Smoker -- 1.00 packs/day  . Smokeless tobacco: Never Used  . Alcohol Use: Yes     Comment: binge drinks  . Drug Use: Yes    Special: Marijuana, Cocaine     Comment: used tonight  . Sexual Activity: Not Asked   Other Topics Concern  . None   Social History Narrative    Hospital Course:    Curtis Ross was admitted for Alcohol use disorder, severe, dependence (HCC) and crisis management.  He was treated with meds listed below.  Medical problems were identified and treated as needed.  Home medications were restarted as appropriate.  Improvement was monitored  by observation and [name] daily report of symptom reduction.  Emotional and mental status was monitored by daily self inventory reports completed by [name] and clinical staff.  Patient reported continued improvement, denied any new concerns.  Patient had been compliant on medications and denied side effects.  Support and encouragement was provided.    Patient did well during inpatient stay.  At time of discharge, patient rated both depression and anxiety levels to be manageable and minimal.  Patient was able to identify the triggers of emotional crises and de-stabilizations.  Patient identified the positive things in life that would help in dealing with feelings of loss, depression and unhealthy or abusive tendencies.         [name] was evaluated by the treatment team for stability and plans for continued recovery upon discharge.  He was offered further treatment options upon discharge including Residential, Intensive Outpatient and Outpatient treatment.  He will follow up with agencies listed below for medication management and counseling.  Encouraged patient to maintain satisfactory support network and home environment.  Advised to adhere to medication compliance and outpatient treatment follow up.      [name] motivation was an integral factor for scheduling further treatment.  Employment, transportation, bed availability, health status, family support, and any pending legal issues were also considered during his hospital stay.  Upon completion of this admission the patient was both  mentally and medically stable for discharge denying suicidal/homicidal ideation, auditory/visual/tactile hallucinations, delusional thoughts and paranoia.      Physical Findings: AIMS:  , ,  ,  ,    CIWA:  CIWA-Ar Total: 1 COWS:     Musculoskeletal: Strength & Muscle Tone: within normal limits Gait & Station: normal Patient leans: N/A  Psychiatric Specialty Exam:   SEE MD SRA Review of Systems  Psychiatric/Behavioral: Negative for depression, hallucinations and substance abuse. The patient is not nervous/anxious.   All other systems reviewed and are negative.   Blood pressure 133/85, pulse 51, temperature 97.7 F (36.5 C), temperature source Oral, resp. rate 16, height 5\' 10"  (1.778 m), weight 81.647 kg (180 lb), SpO2 97 %.Body mass index is 25.83 kg/(m^2).  Have you used any form of tobacco in the last 30 days? (Cigarettes, Smokeless Tobacco, Cigars, and/or Pipes): Yes  Has this patient used any form of tobacco in the last 30 days? (Cigarettes, Smokeless Tobacco, Cigars, and/or Pipes) Yes, Rx given  Blood Alcohol level:  Lab Results  Component Value Date   ETH 306* 05/26/2015   ETH 270* 04/26/2012    Metabolic Disorder Labs:  No results found for: HGBA1C, MPG No results found for: PROLACTIN No results found for: CHOL, TRIG, HDL, CHOLHDL, VLDL, LDLCALC  See Psychiatric Specialty Exam and Suicide Risk Assessment completed by Attending Physician prior to discharge.  Discharge destination:  Home  Is patient on multiple antipsychotic therapies at discharge:  No   Has Patient had three or more failed trials of antipsychotic monotherapy by history:  No  Recommended Plan for Multiple Antipsychotic Therapies: NA     Medication List    TAKE these medications      Indication   citalopram 40 MG tablet  Commonly known as:  CELEXA  Take 1 tablet (40 mg total) by mouth daily.   Indication:  Depression     hydrOXYzine 25 MG tablet  Commonly known as:  ATARAX/VISTARIL  Take 1 tablet (25 mg total) by mouth every 6 (six) hours as needed for anxiety.   Indication:  Anxiety Neurosis     nicotine 21 mg/24hr patch  Commonly known as:  NICODERM CQ - dosed in mg/24 hours  Place 1 patch (21 mg total) onto the skin daily.   Indication:  Nicotine Addiction     traZODone 50 MG tablet  Commonly known as:  DESYREL  Take 1 tablet (50 mg total) by mouth  at bedtime.   Indication:  Trouble Sleeping           Follow-up Information    [facility] On 06[facility].   Why:  Hospital follow up appointment on Wednesday April 12th at 3:30 with Dr. Hope BuddsLelonde. Please call office if you need to reschedule.    Contact information:   [address] [place], [place] [address] [phone]      Follow-up recommendations:  Activity:  as tol Diet:  as tol  Comments:  1.  Take all your medications as prescribed.   2.  Report any adverse side effects to outpatient provider. 3.  Patient instructed to not use alcohol or illegal drugs while on prescription medicines. 4.  In the event of worsening symptoms, instructed patient to call 911, the crisis hotline or go to nearest emergency room for evaluation of symptoms.  Signed: Lindwood QuaSheila May Agustin, NP [facility] 05/28/2015, 2:07 PM  I personally assessed the patient and formulated the plan Madie RenoIrving A. Dub MikesLugo, M.D.

## 2015-05-28 NOTE — B[facility] Suicide Risk Assessment (Signed)
B[facility] INPATIENT:  Family/Significant Other Suicide Prevention Education  Suicide Prevention Education:  Patient Refusal for Family/Significant Other Suicide Prevention Education: The patient Curtis Ross has refused to provide written consent for family/significant other to be provided Family/Significant Other Suicide Prevention Education during admission and/or prior to discharge.  Physician notified. SPE reviewed with patient and brochure provided. Patient encouraged to return to hospital if having suicidal thoughts, patient verbalized his/her understanding and has no further questions at this time.   Jamani Eley, [place]bo[place]ristin L 05/28/2015, 12:40 PM

## 2015-05-28 NOTE — BHH Counselor (Signed)
No PSA completed as patient discharged prior to 72 hours.  Ethel Veronica, LCSW Clinical Social Worker [facility] [phone]  

## 2015-05-28 NOTE — Progress Notes (Signed)
Recreation Therapy Notes  Date: 04.03.2017 Time: 9:30am Location: [address] Dayroom   Group Topic: Stress Management  Goal Area(s) Addresses:  Patient will actively participate in stress management techniques presented during session.   Behavioral Response: Did not attend.   Maysin Carstens L Cristal Qadir, LRT/CTRS        Kanchan Gal L 05/28/2015 3:11 PM 

## 2015-05-28 NOTE — [facility] Suicide Risk Assessment (Signed)
[facility]   Principal Problem: Alcohol use disorder, severe, dependence (HCC) Discharge Diagnoses:  Patient Active Problem List   Diagnosis Date Noted  . Alcohol use disorder, severe, dependence (HCC) [F10.20] 05/26/2015    Total Time spent with patient: 20 minutes  Musculoskeletal: Strength & Muscle Tone: within normal limits Gait & Station: normal Patient leans: normal  Psychiatric Specialty Exam: Review of Systems  Constitutional: Negative.   HENT: Negative.   Eyes: Negative.   Respiratory: Negative.   Cardiovascular: Negative.   Gastrointestinal: Negative.   Genitourinary: Negative.   Musculoskeletal: Negative.   Skin: Negative.   Neurological: Negative.   Endo/Heme/Allergies: Negative.   Psychiatric/Behavioral: Positive for substance abuse. The patient is nervous/anxious.     Blood pressure 133/85, pulse 51, temperature 97.7 F (36.5 C), temperature source Oral, resp. rate 16, height 5\' 10"  (1.778 m), weight 81.647 kg (180 lb), SpO2 97 %.Body mass index is 25.83 kg/(m^2).  General Appearance: Fairly Groomed  Patent attorneyye Contact::  Fair  Speech:  Clear and Coherent409  Volume:  Normal  Mood:  Anxious and wanting to go home to go back to school in the AM  Affect:  Appropriate  Thought Process:  Coherent and Goal Directed  Orientation:  Full (Time, Place, and Person)  Thought Content:  plans as he moves on, relapse prevention plan  Suicidal Thoughts:  No  Homicidal Thoughts:  No  Memory:  Immediate;   Fair Recent;   Fair Remote;   Fair  Judgement:  Fair  Insight:  Present  Psychomotor Activity:  Normal  Concentration:  Fair  Recall:  FiservFair  Fund of Knowledge:Fair  Language: Fair  Akathisia:  No  Handed:  Right  AIMS (if indicated):     Assets:  Desire for Improvement Housing Social Support Vocational/Educational  Sleep:  Number of Hours: 5.75  Cognition: WNL  ADL's:  Intact  In full contact with reality. There are no active S/S of  withdrawal. He is willing and motivated to pursue abstinence. He plans to go back to AA and pursue couseling.  Mental Status Per Nursing Assessment::   On Admission:  Self-harm thoughts  Demographic Factors:  Male, Adolescent or young adult and Caucasian  Loss Factors: none identified  Historical Factors: none identified  Risk Reduction Factors:   Living with another person, sense of responsibility to his family in school  Continued Clinical Symptoms:  Alcohol/Substance Abuse/Dependencies  Cognitive Features That Contribute To Risk:  None    Suicide Risk:  Minimal: No identifiable suicidal ideation.  Patients presenting with no risk factors but with morbid ruminations; may be classified as minimal risk based on the severity of the depressive symptoms    Plan Of Care/Follow-up recommendations:  Activity:  as tolerated Diet:  regular Follow up [facility] Brandan Robicheaux A, MD 05/28/2015, 11:08 AM

## 2015-05-28 NOTE — Progress Notes (Signed)
D: Pt was reading in his room upon initial approach.  Pt has anxious affect and depressed mood.  He reports his day was "all right" and initially denies having a goal.  He made a goal with writer to attend evening group.  Pt denies SI/HI, denies hallucinations, denies pain.  Pt interacts with others cautiously.  Pt attended evening group.   A: Introduced self to pt.  Actively listened to pt and offered support and encouragement.  Medications administered per order.   R: Pt is compliant with medications.  Pt verbally contracts for safety and reports that he will inform staff of needs and concerns.  Will continue to monitor and assess.

## 2015-05-28 NOTE — Tx Team (Addendum)
Interdisciplinary Treatment Plan Update (Adult) Date: 05/28/2015    Time Reviewed: 9:30 AM  Progress in Treatment: Attending groups: Continuing to assess, patient new to milieu Participating in groups: Continuing to assess, patient new to milieu Taking medication as prescribed: Yes Tolerating medication: Yes Family/Significant other contact made: No, CSW assessing for appropriate contacts Patient understands diagnosis: Yes Discussing patient identified problems/goals with staff: Yes Medical problems stabilized or resolved: Yes Denies suicidal/homicidal ideation: Yes Issues/concerns per patient self-inventory: Yes Other:  New problem(s) identified: N/A  Discharge Plan or Barriers: Patient plans to return home to follow up with outpatient services.   Reason for Continuation of Hospitalization:  Depression Anxiety Medication Stabilization   Comments: N/A  Estimated length of stay: Discharge anticipated for today 05/28/15    Patient is a 25 year old male who presented to the hospital with SI with a plan to shoot himself with a gun. Pt reports primary trigger(s) for admission was a recent break up and substance abuse. Patient will benefit from crisis stabilization, medication evaluation, group therapy and psycho education in addition to case management for discharge planning. At discharge, it is recommended that Pt remain compliant with established discharge plan and continued treatment.   Review of initial/current patient goals per problem list:  1. Goal(s): Patient will participate in aftercare plan   Met: Yes   Target date: 3-5 days post admission date   As evidenced by: Patient will participate within aftercare plan AEB aftercare provider and housing plan at discharge being identified.  4/3: Goal met. Patient plans to return home to follow up with outpatient services.    2. Goal (s): Patient will exhibit decreased depressive symptoms and suicidal ideations.   Met:  Adequate for discharge per MD   Target date: 3-5 days post admission date   As evidenced by: Patient will utilize self rating of depression at 3 or below and demonstrate decreased signs of depression or be deemed stable for discharge by MD.  4/3: Adequate for discharge per MD. Patient denies SI, reports feeling safe to return home today.    3. Goal(s): Patient will demonstrate decreased signs and symptoms of anxiety.   Met: Adequate for discharge per MD   Target date: 3-5 days post admission date   As evidenced by: Patient will utilize self rating of anxiety at 3 or below and demonstrated decreased signs of anxiety, or be deemed stable for discharge by MD  4/3: Adequate for discharge per MD. Patient denies SI, reports feeling safe to return home today.     4. Goal(s): Patient will demonstrate decreased signs of withdrawal due to substance abuse   Met: Adequate for discharge per MD   Target date: 3-5 days post admission date   As evidenced by: Patient will produce a CIWA/COWS score of 0, have stable vitals signs, and no symptoms of withdrawal  4/3: CIWA score of 1, experiencing sweating.    Attendees: Patient:    Family:    Physician: Dr. Parke Poisson; Dr. Sabra Heck 05/28/2015 9:30 AM  Nursing: Marcella Dubs, Grayland Ormond, [place], [place], RN 05/28/2015 9:30 AM  Clinical Social Worker: Tilden Fossa, LCSW 05/28/2015 9:30 AM  Other: Peri Maris, LCSWA; [place], LCSW  05/28/2015 9:30 AM  Other: Norberto Sorenson, P4CC 05/28/2015 9:30 AM  Other: Lars Pinks, Case Manager 05/28/2015 9:30 AM  Other: Ethelle Lyon, NP 05/28/2015 9:30 AM  Other:           Scribe for Treatment Team:  Tilden Fossa, [place]

## 2015-05-28 NOTE — BHH Group Notes (Signed)
B[facility] LCSW Aftercare Discharge Planning Group Note  05/28/2015  8:45 AM  Participation Quality: Did Not Attend. Patient invited to participate but declined.  Curtis Ross, MSW, LCSW Clinical Social Worker [facility] [phone]   

## 2015-05-28 NOTE — Progress Notes (Signed)
  [facility] :  Will you be returning to the same living situation after discharge:  Yes,  patient plans to return home with parents At discharge, do you have transportation home?: Yes,  mother will transport Do you have the ability to pay for your medications: Yes,  patient will be provided with prescriptions at discharge  Release of information consent forms completed and in the chart;  Patient's signature needed at discharge.  Patient to Follow up at: Follow-up Information    [facility] On 06[facility]06/2015.   Why:  Hospital follow up appointment on Wednesday April 12th at 3:30 with Dr. Hope BuddsLelonde. Please call office if you need to reschedule.    Contact information:   [address] [place], [place] [address] [phone]      Next level of care provider has access to [facility] Link:yes  Safety Planning and Suicide Prevention discussed: Yes,  with patient  Have you used any form of tobacco in the last 30 days? (Cigarettes, Smokeless Tobacco, Cigars, and/or Pipes): Yes  Has patient been referred to the Quitline?: Patient refused referral  Patient has been referred for addiction treatment: Yes  Trayce Maino, [place]in L 05/28/2015, 12:44 PM

## 2015-06-06 ENCOUNTER — Encounter: Payer: Self-pay | Admitting: Family Medicine

## 2015-06-06 ENCOUNTER — Ambulatory Visit (INDEPENDENT_AMBULATORY_CARE_PROVIDER_SITE_OTHER): Payer: Self-pay | Admitting: Family Medicine

## 2015-06-06 VITALS — BP 112/80 | HR 62 | Wt 187.0 lb

## 2015-06-06 DIAGNOSIS — F329 Major depressive disorder, single episode, unspecified: Secondary | ICD-10-CM

## 2015-06-06 DIAGNOSIS — F1020 Alcohol dependence, uncomplicated: Secondary | ICD-10-CM

## 2015-06-06 DIAGNOSIS — F32A Depression, unspecified: Secondary | ICD-10-CM

## 2015-06-06 MED ORDER — CITALOPRAM HYDROBROMIDE 40 MG PO TABS: 40.0000 mg | ORAL_TABLET | Freq: Every day | ORAL | Status: DC

## 2015-06-06 MED ORDER — HYDROXYZINE HCL 25 MG PO TABS: 25.0000 mg | ORAL_TABLET | Freq: Four times a day (QID) | ORAL | Status: DC | PRN

## 2015-06-06 MED ORDER — TRAZODONE HCL 50 MG PO TABS: 50.0000 mg | ORAL_TABLET | Freq: Every day | ORAL | Status: DC

## 2015-06-07 ENCOUNTER — Encounter: Payer: Self-pay | Admitting: Family Medicine

## 2015-06-07 NOTE — Progress Notes (Signed)
   Subjective:    Patient ID: Curtis PilgrimBenjamin L [place]derston, male    DOB: May 15, 1990, 25 y.o.   MRN: [id number]  HPI He is here for a follow-up visit after recent hospitalization for suicidal thoughts and depression. He was in the psychiatric hospital for several days and then seen recently at behavioral health. Presently he is on Celexa, hydroxyzine and trazodone. He is using the hydroxyzine and trazodone on an as-needed basis. He is also [facility]. He is also going to [employer] meetings. Further discussion with him indicates he does have good insight into what causes his issues. They revolve around using alcohol for stress management, dealing with previous experiences during his teenage years and that he is now regretful of and still has guilt over this.. He also recently broke up with his girlfriend.   Review of Systems     Objective:   Physical Exam Alert and in no distress with appropriate affect       Assessment & Plan:  Depression - Plan: traZODone (DESYREL) 50 MG tablet, hydrOXYzine (ATARAX/VISTARIL) 25 MG tablet, citalopram (CELEXA) 40 MG tablet  Alcohol use disorder, severe, dependence (HCC) I will continue him on his present medication regimen and he will continue in counseling as well as going to AA. Encouraged him to continue to work on fixing himself and not get involved in another relationship at this point. Also stressed the need for him to eventually get off the trazodone and hydroxyzine and rely only on the Celexa. Recheck here in one month.

## 2015-07-09 ENCOUNTER — Encounter: Payer: Self-pay | Admitting: Family Medicine

## 2015-07-09 ENCOUNTER — Ambulatory Visit (INDEPENDENT_AMBULATORY_CARE_PROVIDER_SITE_OTHER): Payer: Self-pay | Admitting: Family Medicine

## 2015-07-09 VITALS — BP 116/70 | HR 75 | Wt 196.0 lb

## 2015-07-09 DIAGNOSIS — F329 Major depressive disorder, single episode, unspecified: Secondary | ICD-10-CM

## 2015-07-09 DIAGNOSIS — F1020 Alcohol dependence, uncomplicated: Secondary | ICD-10-CM

## 2015-07-09 DIAGNOSIS — F32A Depression, unspecified: Secondary | ICD-10-CM

## 2015-07-09 NOTE — Progress Notes (Signed)
   Subjective:    Patient ID: Curtis Ross, male    DOB: Mar 26, 1990, 25 y.o.   MRN: [id number]  HPI  he is here for recheck. Presently he is doing quite well. He is involved in [facility] and making progress. There helping him with his thought processes in terms of having them be much more positive. He is no longer taking trazodone but still is on hydroxyzine and Celexa. He has been to a few AA meetings but has found them to be less useful. He states that sometimes make him feel like he needs to go have a drink. Overall he is quite happy.   Review of Systems     Objective:   Physical Exam  alert and in no distress with appropriate affect.       Assessment & Plan:  Depression  Alcohol use disorder, severe, dependence (HCC)  he will continue on his present regimen and follow-up with psychology department. He admits that his alcohol use in the past was really too numb himself to the underlying depression. I doubt long-term AA will be useful to him.

## 2015-08-30 ENCOUNTER — Ambulatory Visit (INDEPENDENT_AMBULATORY_CARE_PROVIDER_SITE_OTHER): Payer: Self-pay | Admitting: Family Medicine

## 2015-08-30 ENCOUNTER — Encounter: Payer: Self-pay | Admitting: Family Medicine

## 2015-08-30 ENCOUNTER — Other Ambulatory Visit: Payer: Self-pay

## 2015-08-30 VITALS — BP 120/80 | HR 62 | Ht 70.0 in | Wt 191.6 lb

## 2015-08-30 DIAGNOSIS — F329 Major depressive disorder, single episode, unspecified: Secondary | ICD-10-CM

## 2015-08-30 DIAGNOSIS — F32A Depression, unspecified: Secondary | ICD-10-CM

## 2015-08-30 MED ORDER — CITALOPRAM HYDROBROMIDE 40 MG PO TABS: 40.0000 mg | ORAL_TABLET | Freq: Every day | ORAL | Status: DC

## 2015-08-30 MED ORDER — CITALOPRAM HYDROBROMIDE 40 MG PO TABS
40.0000 mg | ORAL_TABLET | Freq: Every day | ORAL | Status: DC
Start: 2015-08-30 — End: 2015-08-30

## 2015-08-30 NOTE — Progress Notes (Signed)
   Subjective:    Patient ID: Curtis PilgrimBenjamin L Ross, male    DOB: 1991/01/10, 25 y.o.   MRN: [id number]  HPI He is here for recheck. He continues in counseling and is making good progress there. He is dealing with the guilty has had over past shoes. He is also learning mindfulness. He feels very good about this and states that he is at least 70% better. He is about ready to start back in school and has a very good GPA. He plans to go [facility] [employer] [facility] [facility].   Review of Systems     Objective:   Physical Exam Alert and in no distress with appropriate affect.       Assessment & Plan:  Depression. Continue Celexa at the present dosing. He will continue in counseling. I will recheck him roughly 6 months but did discuss the fact that I will probably keep him on medicine through until next spring. Encouraged him to continue with counseling and also cut back on taking the hydroxyzine in the morning and only take it on an as-needed basis

## 2015-08-31 ENCOUNTER — Ambulatory Visit: Payer: Self-pay | Admitting: Family Medicine

## 2017-04-30 ENCOUNTER — Ambulatory Visit: Payer: 59 | Admitting: Family Medicine

## 2017-04-30 ENCOUNTER — Encounter: Payer: Self-pay | Admitting: Family Medicine

## 2017-04-30 VITALS — BP 128/82 | HR 74 | Temp 98.4°F | Resp 16 | Wt 175.4 lb

## 2017-04-30 DIAGNOSIS — R197 Diarrhea, unspecified: Secondary | ICD-10-CM | POA: Diagnosis not present

## 2017-04-30 DIAGNOSIS — R1110 Vomiting, unspecified: Secondary | ICD-10-CM | POA: Diagnosis not present

## 2017-04-30 MED ORDER — ONDANSETRON 4 MG PO TBDP: 4.0000 mg | ORAL_TABLET | Freq: Three times a day (TID) | ORAL | 0 refills | Status: DC | PRN

## 2017-04-30 NOTE — Patient Instructions (Signed)
Continue staying hydrated. Use ibuprofen and heat for your neck.  I sent in Zofran for nausea.  Call or return if your symptoms get worse.

## 2017-04-30 NOTE — Progress Notes (Signed)
   Subjective:    Patient ID: Curtis Ross, male    DOB: [birth year], 27 y.o.   MRN: [id number]  HPI Chief Complaint  Patient presents with  . sick    monday night, nausea,diarrhea,vomitting, achy. started feeling better last night but still has neck pain and soreness   He is here with complaints of a 2 day history of chills, headache, body aches, nausea, vomiting and diarrhea. States vomiting and diarrhea stopped yesterday. Continues to feel "sore" all over especially his chest wall and lateral neck muscles.   Denies fever, rhinorrhea, nasal congestion, sore throat, cough, abdominal pain, arthralgias. No urinary symptoms.   Denies recent antibiotics, travel or new foods.    Taking 800 mg Ibuprofen and Tylenol.   Drinks alcohol regularly.   Reviewed allergies, medications, past medical, surgical, family, and social history.   Review of Systems Pertinent positives and negatives in the history of present illness.      Objective:   Physical Exam BP 128/82   Pulse 74   Temp 98.4 F (36.9 C) (Oral)   Resp 16   Wt 175 lb 6.4 oz (79.6 kg)   SpO2 98%   BMI 25.17 kg/m  Alert and in no distress. Pharyngeal area is normal. Neck is supple without adenopathy or thyromegaly. Cardiac exam shows a regular sinus rhythm without murmurs or gallops. Chest wall TTP at 2nd LUSB. Lungs are clear to auscultation, normal work of breathing. Abdomen is soft, non distended, normal bowel sounds, non tender, no rebound or guarding, no masses. Skin is warm and dry, no pallor or rash.         Assessment & Plan:  Vomiting and diarrhea - Plan: ondansetron (ZOFRAN ODT) 4 MG disintegrating tablet  He appears to be doing much better than onset of symptoms. No obvious infectious process and hemodynamically stable. Zofran prescribed if needed for nausea. Stay hydrated. May use ibuprofen or tylenol and heat for body neck pain and body aches.   Work note given per patient request.

## 2017-08-31 ENCOUNTER — Other Ambulatory Visit: Payer: Self-pay

## 2017-08-31 ENCOUNTER — Encounter (HOSPITAL_COMMUNITY): Payer: Self-pay

## 2017-08-31 ENCOUNTER — Emergency Department (HOSPITAL_COMMUNITY)
Admission: EM | Admit: 2017-08-31 | Discharge: 2017-08-31 | Discharge status: Against Medical Advice | Payer: Self-pay | Attending: Emergency Medicine | Admitting: Emergency Medicine

## 2017-08-31 DIAGNOSIS — F1020 Alcohol dependence, uncomplicated: Secondary | ICD-10-CM | POA: Insufficient documentation

## 2017-08-31 DIAGNOSIS — Z046 Encounter for general psychiatric examination, requested by authority: Secondary | ICD-10-CM | POA: Insufficient documentation

## 2017-08-31 DIAGNOSIS — F329 Major depressive disorder, single episode, unspecified: Secondary | ICD-10-CM | POA: Insufficient documentation

## 2017-08-31 DIAGNOSIS — Z5321 Procedure and treatment not carried out due to patient leaving prior to being seen by health care provider: Secondary | ICD-10-CM | POA: Insufficient documentation

## 2017-08-31 DIAGNOSIS — F17200 Nicotine dependence, unspecified, uncomplicated: Secondary | ICD-10-CM | POA: Insufficient documentation

## 2017-08-31 DIAGNOSIS — R45851 Suicidal ideations: Secondary | ICD-10-CM | POA: Insufficient documentation

## 2017-08-31 DIAGNOSIS — F10929 Alcohol use, unspecified with intoxication, unspecified: Secondary | ICD-10-CM | POA: Insufficient documentation

## 2017-08-31 NOTE — ED Triage Notes (Signed)
Pt requesting medical clearence before going to a tx facility, pt states "I've gotta be medically cleared before I can go to get detoxed from alcohol, trying to go to [facility]."   Last drink was about two hours ago. Pt endorses that he is "drunk as f**k."

## 2017-08-31 NOTE — ED Notes (Signed)
Bed: [facility] Expected date:  Expected time:  Means of arrival:  Comments: 

## 2017-08-31 NOTE — ED Notes (Signed)
Per staff pt stated that he was going outside to smoke a cigarette. Pt was asked multiple times to not leave the area. Pt ignored staff's request and proceeded to leave room area. This RN met pt at triage door with belongings and requested pt check back into the ED lobby.

## 2017-08-31 NOTE — ED Notes (Signed)
Triage RN aware of pulse.

## 2017-09-01 ENCOUNTER — Encounter (HOSPITAL_COMMUNITY): Payer: Self-pay | Admitting: Emergency Medicine

## 2017-09-01 ENCOUNTER — Emergency Department (HOSPITAL_COMMUNITY)
Admission: EM | Admit: 2017-09-01 | Discharge: 2017-09-01 | Disposition: A | Discharge status: Home / Self Care | Payer: Self-pay | Attending: Emergency Medicine | Admitting: Emergency Medicine

## 2017-09-01 DIAGNOSIS — F1010 Alcohol abuse, uncomplicated: Secondary | ICD-10-CM

## 2017-09-01 DIAGNOSIS — F1020 Alcohol dependence, uncomplicated: Secondary | ICD-10-CM | POA: Diagnosis present

## 2017-09-01 LAB — COMPREHENSIVE METABOLIC PANEL
ALK PHOS: 71 U/L (ref 38–126)
ALT: 17 U/L (ref 0–44)
AST: 17 U/L (ref 15–41)
Albumin: 3.7 g/dL (ref 3.5–5.0)
Anion gap: 11 (ref 5–15)
BUN: 8 mg/dL (ref 6–20)
CALCIUM: 8.5 mg/dL — AB (ref 8.9–10.3)
CHLORIDE: 107 mmol/L (ref 98–111)
CO2: 25 mmol/L (ref 22–32)
CREATININE: 1.01 mg/dL (ref 0.61–1.24)
Glucose, Bld: 111 mg/dL — ABNORMAL HIGH (ref 70–99)
Potassium: 4.1 mmol/L (ref 3.5–5.1)
Sodium: 143 mmol/L (ref 135–145)
Total Bilirubin: 0.5 mg/dL (ref 0.3–1.2)
Total Protein: 6.7 g/dL (ref 6.5–8.1)

## 2017-09-01 LAB — ACETAMINOPHEN LEVEL

## 2017-09-01 LAB — RAPID URINE DRUG SCREEN, HOSP PERFORMED
AMPHETAMINES: NOT DETECTED
BENZODIAZEPINES: NOT DETECTED
Cocaine: NOT DETECTED
Opiates: NOT DETECTED
Tetrahydrocannabinol: NOT DETECTED

## 2017-09-01 LAB — CBC
HCT: 49.7 % (ref 39.0–52.0)
HEMOGLOBIN: 17.4 g/dL — AB (ref 13.0–17.0)
MCH: 34.4 pg — AB (ref 26.0–34.0)
MCHC: 35 g/dL (ref 30.0–36.0)
MCV: 98.2 fL (ref 78.0–100.0)
Platelets: 250 10*3/uL (ref 150–400)
RBC: 5.06 MIL/uL (ref 4.22–5.81)
RDW: 14.3 % (ref 11.5–15.5)
WBC: 7.2 10*3/uL (ref 4.0–10.5)

## 2017-09-01 LAB — SALICYLATE LEVEL

## 2017-09-01 LAB — ETHANOL: Alcohol, Ethyl (B): 209 mg/dL — ABNORMAL HIGH (ref <10)

## 2017-09-01 MED ORDER — ALUM & MAG HYDROXIDE-SIMETH 200-200-20 MG/5ML PO SUSP: 30.0000 mL | Freq: Four times a day (QID) | ORAL | Status: DC | PRN

## 2017-09-01 MED ORDER — IBUPROFEN 200 MG PO TABS: 600.0000 mg | ORAL_TABLET | Freq: Three times a day (TID) | ORAL | Status: DC | PRN

## 2017-09-01 MED ORDER — ONDANSETRON HCL 4 MG PO TABS: 4.0000 mg | ORAL_TABLET | Freq: Three times a day (TID) | ORAL | Status: DC | PRN

## 2017-09-01 NOTE — ED Provider Notes (Signed)
[facility]-EMERGENCY DEPT Provider Note   CSN: [id number] Arrival date & time: 08/31/17  2328     History   Chief Complaint Chief Complaint  Patient presents with  . Medical Clearance  . Suicidal    HPI Curtis Ross is a 27 y.o. male.  The history is provided by the patient.  Alcohol Problem  This is a chronic problem. The current episode started more than 1 week ago. The problem occurs constantly. The problem has not changed since onset.Pertinent negatives include no chest pain, no abdominal pain, no headaches and no shortness of breath. Nothing aggravates the symptoms. Nothing relieves the symptoms. He has tried nothing for the symptoms. The treatment provided no relief.  Last drink several hours ago.  Denies SI to me.  Drinks 9-10 drinks a day mixed beer and liquor.  No HI no AH no VH  Past Medical History:  Diagnosis Date  . Medical history non-contributory     Patient Active Problem List   Diagnosis Date Noted  . GAD (generalized anxiety disorder) 05/28/2015  . Alcohol use disorder, severe, dependence (HCC) 05/26/2015    Past Surgical History:  Procedure Laterality Date  . TYMPANOSTOMY TUBE PLACEMENT          Home Medications    Prior to Admission medications   Medication Sig Start Date End Date Taking? Authorizing Provider  ibuprofen (ADVIL,MOTRIN) 200 MG tablet Take 800 mg by mouth every 6 (six) hours as needed for moderate pain.   Yes [provider]  ondansetron (ZOFRAN ODT) 4 MG disintegrating tablet Take 1 tablet (4 mg total) by mouth every 8 (eight) hours as needed for nausea or vomiting. Patient not taking: Reported on 09/01/2017 04/30/17   Avanell ShackletonHenson, Vickie L, NP-C    Family History History reviewed. No pertinent family history.  Social History Social History   Tobacco Use  . Smoking status: Current Every Day Smoker    Packs/day: 1.00  . Smokeless tobacco: Never Used  Substance Use Topics  . Alcohol use: Yes    Comment: binge drinks  . Drug use: Yes    Types: Marijuana, Cocaine    Comment: used tonight     Allergies   Patient has no known allergies.   Review of Systems Review of Systems  Respiratory: Negative for shortness of breath.   Cardiovascular: Negative for chest pain.  Gastrointestinal: Negative for abdominal pain.  Neurological: Negative for headaches.  Psychiatric/Behavioral: Negative for dysphoric mood, hallucinations, self-injury, sleep disturbance and suicidal ideas. The patient is not nervous/anxious and is not hyperactive.   All other systems reviewed and are negative.    Physical Exam Updated Vital Signs BP 120/84 (BP Location: Left Arm)   Pulse (!) 108   Temp 98.4 F (36.9 C) (Oral)   Resp 16   Ht 5\' 11"  (1.803 m)   Wt 81.6 kg (180 lb)   SpO2 100%   BMI 25.10 kg/m   Physical Exam  Constitutional: He is oriented to person, place, and time. He appears well-developed and well-nourished. No distress.  HENT:  Head: Normocephalic and atraumatic.  Mouth/Throat: No oropharyngeal exudate.  Eyes: Pupils are equal, round, and reactive to light. Conjunctivae are normal.  Neck: Normal range of motion. Neck supple.  Cardiovascular: Normal rate, regular rhythm, normal heart sounds and intact distal pulses.  Pulmonary/Chest: Effort normal and breath sounds normal. No stridor. He has no wheezes. He has no rales.  Abdominal: Soft. Bowel sounds are normal. He exhibits no mass. There  is no tenderness. There is no rebound and no guarding.  Musculoskeletal: Normal range of motion. He exhibits no edema.  Neurological: He is alert and oriented to person, place, and time. He displays normal reflexes.  Skin: Skin is warm and dry. Capillary refill takes less than 2 seconds.  Psychiatric: His speech is not rapid and/or pressured. He is not actively hallucinating. Thought content is not delusional. He expresses no suicidal plans and no homicidal plans.     ED Treatments / Results    Labs (all labs ordered are listed, but only abnormal results are displayed) Results for orders placed or performed during the hospital encounter of 09/01/17  Comprehensive metabolic panel  Result Value Ref Range   Sodium 143 135 - 145 mmol/L   Potassium 4.1 3.5 - 5.1 mmol/L   Chloride 107 98 - 111 mmol/L   CO2 25 22 - 32 mmol/L   Glucose, Bld 111 (H) 70 - 99 mg/dL   BUN 8 6 - 20 mg/dL   Creatinine, Ser 0.98 0.61 - 1.24 mg/dL   Calcium 8.5 (L) 8.9 - 10.3 mg/dL   Total Protein 6.7 6.5 - 8.1 g/dL   Albumin 3.7 3.5 - 5.0 g/dL   AST 17 15 - 41 U/L   ALT 17 0 - 44 U/L   Alkaline Phosphatase 71 38 - 126 U/L   Total Bilirubin 0.5 0.3 - 1.2 mg/dL   GFR calc non Af Amer >60 >60 mL/min   GFR calc Af Amer >60 >60 mL/min   Anion gap 11 5 - 15  Salicylate level  Result Value Ref Range   Salicylate Lvl <7.0 2.8 - 30.0 mg/dL  Acetaminophen level  Result Value Ref Range   Acetaminophen (Tylenol), Serum <10 (L) 10 - 30 ug/mL  Rapid urine drug screen (hospital performed)  Result Value Ref Range   Opiates NONE DETECTED NONE DETECTED   Cocaine NONE DETECTED NONE DETECTED   Benzodiazepines NONE DETECTED NONE DETECTED   Amphetamines NONE DETECTED NONE DETECTED   Tetrahydrocannabinol NONE DETECTED NONE DETECTED   Barbiturates (A) NONE DETECTED    Result not available. Reagent lot number recalled by manufacturer.   No results found.  None  Radiology No results found.  Procedures Procedures (including critical care time)   Final Clinical Impressions(s) / ED Diagnoses   He has denied SI to me, he had some passive Si for triage.  Consult to TTS placed will go on their recommendation.     Minervia Osso, MD 09/01/17 [phone]

## 2017-09-01 NOTE — ED Notes (Signed)
Patient denies pain and is resting comfortably.  

## 2017-09-01 NOTE — ED Notes (Signed)
Dr.Palumbo in to evaluate pt.
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
## 2017-09-01 NOTE — Discharge Instructions (Signed)
To help you maintain a sober lifestyle, a substance abuse treatment program may be beneficial to you.  Contact [facility] at your earliest opportunity to ask about enrolling in their program: ° °     [facility] °     [address] °     [place], [place] [address] °     [phone] °     New patients are seen at the walk-in clinic every Tuesday from 9:00 am - 12:00 pm °

## 2017-09-01 NOTE — [facility] Suicide Risk Assessment (Signed)
Suicide Risk Assessment  Discharge Assessment   [facility] Discharge Suicide [facility] Assessment   Principal Problem: Alcohol use disorder, severe, dependence [facility](HCC) Discharge Diagnoses:  Patient Active Problem List   Diagnosis Date Noted  . Alcohol use disorder, severe, dependence (HCC) [F10.20] 05/26/2015    Priority: High  . GAD (generalized anxiety disorder) [F41.1] 05/28/2015    Total Time spent with patient: 45 minutes  Musculoskeletal: Strength & Muscle Tone: within normal limits Gait & Station: normal Patient leans: N/A  Psychiatric Specialty Exam:   Blood pressure 118/79, pulse 73, temperature 98.4 F (36.9 C), temperature source Oral, resp. rate 16, height 5\' 11"  (1.803 m), weight 81.6 kg (180 lb), SpO2 98 %.Body mass index is 25.1 kg/m.  General Appearance: Disheveled  Eye Contact::  Good  Speech:  Normal Rate409  Volume:  Normal  Mood:  Euthymic  Affect:  Congruent  Thought Process:  Coherent and Descriptions of Associations: Intact  Orientation:  Full (Time, Place, and Person)  Thought Content:  WDL and Logical  Suicidal Thoughts:  No  Homicidal Thoughts:  No  Memory:  Immediate;   Good Recent;   Good Remote;   Good  Judgement:  Fair  Insight:  Fair  Psychomotor Activity:  Normal  Concentration:  Good  Recall:  Good  Fund of Knowledge:Fair  Language: Good  Akathisia:  No  Handed:  Right  AIMS (if indicated):     Assets:  Leisure Time Physical Health Resilience Social Support  Sleep:     Cognition: WNL  ADL's:  Intact   Mental Status Per Nursing Assessment::   On Admission:   27 yo male who came to the ED for medical clearance to go to rehab.  No suicidal/homicidal ideations, hallucinations, or withdrawals symptoms.  [facility]  Demographic Factors:  Male, Adolescent or young adult and Caucasian  Loss Factors: NA  Historical Factors: NA  Risk Reduction Factors:   Sense of responsibility to family, Living with another  person, especially a relative, Positive social support and Positive therapeutic relationship  Continued Clinical Symptoms:  Alcohol abuse  Cognitive Features That Contribute To Risk:  None    Suicide Risk:  Minimal: No identifiable suicidal ideation.  Patients presenting with no risk factors but with morbid ruminations; may be classified as minimal risk based on the severity of the depressive symptoms    Plan Of Care/Follow-up recommendations:  Activity:  as tolerated Diet:  heart healthy diet  Maysoon Lozada, NP 09/01/2017, 2:40 PM

## 2017-09-01 NOTE — ED Notes (Signed)
Bed: [facility] Expected date:  Expected time:  Means of arrival:  Comments: 

## 2017-09-01 NOTE — ED Notes (Signed)
Pt discharged home. Discharged instructions read to pt who verbalized understanding. All belongings returned to pt who signed for same. Denies SI/HI, is not delusional and not responding to internal stimuli. Escorted pt to the ED exit.    

## 2017-09-01 NOTE — BH Assessment (Signed)
[facility] Assessment Progress Note  Per Jacqueline Norman, DO, this pt does not require psychiatric hospitalization at this time.  [facility] with [facility].  This has been included in pt's discharge instructions.  Pt would also benefit from seeing Peer Support Specialists; they will be asked to speak to pt.  Pt's nurse, Diane, has been notified.  Curtis Hassebrock, MA Triage Specialist [phone]     

## 2017-09-01 NOTE — ED Triage Notes (Addendum)
Pt reports wanting to be medically cleared to go to rehab. Pt reports drinking alcohol yesterday. Pt reports having SI without a plan.

## 2017-09-01 NOTE — Patient Outreach (Signed)
ED Peer Support Specialist Patient Intake (Complete at intake & 30-60 Day Follow-up)  Name: Curtis Ross  MRN: [id number]  Age: 27 y.o.   Date of Admission: 09/01/2017  Intake: Initial Comments:      Primary Reason Admitted: Alcohol Abuse  Lab values: Alcohol/ETOH: Positive Positive UDS? No Amphetamines: No Barbiturates: No Benzodiazepines: No Cocaine: No Opiates: No Cannabinoids: No  Demographic information: Gender: Male Ethnicity: White Marital Status: Single Insurance Status: Uninsured/Self-pay Ecologist (Work Neurosurgeon, Physicist, medical, etc.: No Lives with: Partner/Spouse Living situation: House/Apartment  Reported Patient History: Patient reported health conditions: Depression Patient aware of HIV and hepatitis status: No  In past year, has patient visited ED for any reason? Yes  Number of ED visits: 1  Reason(s) for visit: Health concerns   In past year, has patient been hospitalized for any reason? No  Number of hospitalizations:    Reason(s) for hospitalization:    In past year, has patient been arrested? No  Number of arrests:    Reason(s) for arrest:    In past year, has patient been incarcerated? No  Number of incarcerations:    Reason(s) for incarceration:    In past year, has patient received medication-assisted treatment? No  In past year, patient received the following treatments: Other (comment)  In past year, has patient received any harm reduction services? No  Did this include any of the following?    In past year, has patient received care from a mental health provider for diagnosis other than SUD? No  In past year, is this first time patient has overdosed? No  Number of past overdoses:    In past year, is this first time patient has been hospitalized for an overdose? No  Number of hospitalizations for overdose(s):    Is patient currently receiving treatment for a mental health diagnosis?  No  Patient reports experiencing difficulty participating in SUD treatment: No    Most important reason(s) for this difficulty?    Has patient received prior services for treatment? No  In past, patient has received services from following agencies:    Plan of Care:  Suggested follow up at these agencies/treatment centers: Other (comment)(Plans to attend [facility] for Detox and treatment services. )  Other information: CPSS met with Pt and was made aware that he has been on the phone with [facility] services and able to get himself in a bed. CPSS was made aware that he had contact the facility and completed the Ph assessment on his own. CPSS was able to complete the series of questions needed and also gain information from Pt to help understand what Pt was dealing with. CPSS praised Pt for making the effort to get himself checked into a facility to better the quality of his life. CPSS mentioned to Pt that if he needs any outside assistance he can contact CPSS for support.     Aaron Edelman Pressley Barsky, CPSS  09/01/2017 11:38 AM

## 2018-03-15 ENCOUNTER — Encounter (HOSPITAL_COMMUNITY): Payer: Self-pay

## 2018-03-15 ENCOUNTER — Other Ambulatory Visit: Payer: Self-pay

## 2018-03-15 ENCOUNTER — Emergency Department (HOSPITAL_COMMUNITY)
Admission: EM | Admit: 2018-03-15 | Discharge: 2018-03-15 | Disposition: A | Discharge status: Home / Self Care | Payer: Self-pay | Attending: Emergency Medicine | Admitting: Emergency Medicine

## 2018-03-15 ENCOUNTER — Emergency Department (HOSPITAL_COMMUNITY): Payer: Self-pay

## 2018-03-15 DIAGNOSIS — R0789 Other chest pain: Secondary | ICD-10-CM | POA: Insufficient documentation

## 2018-03-15 DIAGNOSIS — F17200 Nicotine dependence, unspecified, uncomplicated: Secondary | ICD-10-CM | POA: Insufficient documentation

## 2018-03-15 DIAGNOSIS — W51XXXA Accidental striking against or bumped into by another person, initial encounter: Secondary | ICD-10-CM | POA: Insufficient documentation

## 2018-03-15 MED ORDER — IBUPROFEN 200 MG PO TABS
400.0000 mg | ORAL_TABLET | Freq: Once | ORAL | Status: AC
  Administered 2018-03-15: 400 mg via ORAL
  Filled 2018-03-15: qty 2

## 2018-03-15 NOTE — ED Notes (Signed)
Patient's sober companion is driving him home.

## 2018-03-15 NOTE — ED Provider Notes (Signed)
[facility]-EMERGENCY DEPT Provider Note   CSN: [id number] Arrival date & time: 03/15/18  0435     History   Chief Complaint Chief Complaint  Patient presents with  . Rib Pain    HPI Curtis Ross is a 28 y.o. male.  The history is provided by the patient.  He has history of alcohol use disorder and comes in complaining of chest injury.  He had been drinking, and was wrestling with someone when he injured his chest.  He will not explain exactly how the injury occurred, but it is mainly on the left side of the chest anteriorly.  Pain is worse with taking a breath or laughing.  He is unable to put a number on his pain.  Past Medical History:  Diagnosis Date  . Medical history non-contributory     Patient Active Problem List   Diagnosis Date Noted  . GAD (generalized anxiety disorder) 05/28/2015  . Alcohol use disorder, severe, dependence (HCC) 05/26/2015    Past Surgical History:  Procedure Laterality Date  . TYMPANOSTOMY TUBE PLACEMENT          Home Medications    Prior to Admission medications   Medication Sig Start Date End Date Taking? Authorizing Provider  ibuprofen (ADVIL,MOTRIN) 200 MG tablet Take 800 mg by mouth every 6 (six) hours as needed for moderate pain.    [provider]  ondansetron (ZOFRAN ODT) 4 MG disintegrating tablet Take 1 tablet (4 mg total) by mouth every 8 (eight) hours as needed for nausea or vomiting. Patient not taking: Reported on 09/01/2017 04/30/17   Avanell ShackletonHenson, Vickie L, NP-C    Family History History reviewed. No pertinent family history.  Social History Social History   Tobacco Use  . Smoking status: Current Every Day Smoker    Packs/day: 1.00  . Smokeless tobacco: Never Used  Substance Use Topics  . Alcohol use: Yes    Comment: binge drinks  . Drug use: Yes    Types: Marijuana, Cocaine    Comment: used tonight     Allergies   Patient has no known allergies.   Review of Systems Review  of Systems  All other systems reviewed and are negative.    Physical Exam Updated Vital Signs BP 134/87 (BP Location: Left Arm)   Pulse (!) 108   Temp 98.4 F (36.9 C) (Oral)   Resp 16   Ht 5\' 10"  (1.778 m)   Wt 81.6 kg   SpO2 97%   BMI 25.83 kg/m   Physical Exam Vitals signs and nursing note reviewed.    28 year old male, resting comfortably and in no acute distress. Vital signs are significant for mildly elevated heart rate. Oxygen saturation is 97%, which is normal. Head is normocephalic and atraumatic. PERRLA, EOMI. Oropharynx is clear. Neck is nontender and supple without adenopathy or JVD. Back is nontender and there is no CVA tenderness. Lungs are clear without rales, wheezes, or rhonchi. Chest is tender diffusely across the anterior chest wall without crepitus. Heart has regular rate and rhythm without murmur. Abdomen is soft, flat, nontender without masses or hepatosplenomegaly and peristalsis is normoactive. Extremities have no cyanosis or edema, full range of motion is present. Skin is warm and dry without rash. Neurologic: Mental status is normal, cranial nerves are intact, there are no motor or sensory deficits.  ED Treatments / Results   Radiology Dg Chest 2 View  Result Date: 03/15/2018 CLINICAL DATA:  Left chest injury while wrestling EXAM:  CHEST - 2 VIEW COMPARISON:  None. FINDINGS: The heart size and mediastinal contours are within normal limits. Both lungs are clear. The visualized skeletal structures are unremarkable. IMPRESSION: Negative chest Electronically Signed   By: Marnee SpringJonathon  Watts M.D.   On: 03/15/2018 05:19    Procedures Procedures   Medications Ordered in ED Medications  ibuprofen (ADVIL,MOTRIN) tablet 400 mg (400 mg Oral Given 03/15/18 0505)     Initial Impression / Assessment and Plan / ED Course  I have reviewed the triage vital signs and the nursing notes.  Pertinent imaging results that were available during my care of the patient  were reviewed by me and considered in my medical decision making (see chart for details).  Musculoskeletal chest pain.  He is sent for chest x-ray to rule out more serious pathology.  Old records are reviewed, showing prior ED visits for alcohol abuse and suicidal ideation.  Chest x-ray shows no evidence of significant injury.  He is discharged with instructions to apply ice and use over-the-counter analgesics as needed for pain.  Final Clinical Impressions(s) / ED Diagnoses   Final diagnoses:  Musculoskeletal chest pain    ED Discharge Orders    None       Dione BoozeGlick, Janziel Hockett, MD 03/15/18 [phone]

## 2018-03-15 NOTE — Discharge Instructions (Addendum)
Apply ice for thirty minutes at a time, four times a day.  Take acetaminophen and/or ibuprofen as needed for pain.

## 2018-03-15 NOTE — ED Triage Notes (Signed)
Pt reports R rib pain. States he wrestles.

## 2018-03-15 NOTE — ED Notes (Signed)
Patient transported to X-ray via stretcher 

## 2018-08-12 ENCOUNTER — Emergency Department (HOSPITAL_COMMUNITY): Payer: Self-pay

## 2018-08-12 ENCOUNTER — Emergency Department (HOSPITAL_COMMUNITY)
Admission: EM | Admit: 2018-08-12 | Discharge: 2018-08-12 | Disposition: A | Discharge status: Home / Self Care | Payer: Self-pay | Attending: Emergency Medicine | Admitting: Emergency Medicine

## 2018-08-12 ENCOUNTER — Other Ambulatory Visit: Payer: Self-pay

## 2018-08-12 ENCOUNTER — Encounter (HOSPITAL_COMMUNITY): Payer: Self-pay

## 2018-08-12 DIAGNOSIS — Z79899 Other long term (current) drug therapy: Secondary | ICD-10-CM | POA: Insufficient documentation

## 2018-08-12 DIAGNOSIS — M25562 Pain in left knee: Secondary | ICD-10-CM | POA: Insufficient documentation

## 2018-08-12 DIAGNOSIS — F17210 Nicotine dependence, cigarettes, uncomplicated: Secondary | ICD-10-CM | POA: Insufficient documentation

## 2018-08-12 NOTE — Discharge Instructions (Signed)
Can take tylenol or motrin for pain.  I would recommend trying to ice and elevate you knee as well. Follow-up with Dr. Tamera Punt-- call his office for appt. Return here for any new/acute changes.

## 2018-08-12 NOTE — ED Provider Notes (Signed)
[facility]-EMERGENCY DEPT Provider Note   CSN: [id number] Arrival date & time: 08/12/18  0430     History   Chief Complaint Chief Complaint  Patient presents with  . Knee Pain    L    HPI Curtis Ross is a 28 y.o. male.     The history is provided by the patient and medical records.  Knee Pain    28 year old male here with left knee pain.  He reports this is an old injury from a while ago but he twisted his knee again last evening.  He states he is not exactly sure how this happened.  He has been drinking tonight, estimates at least five 24 ounce beers.  He reports pain along medial aspect of left knee.  He denies any numbness or weakness.  States he is not currently able to walk on his leg since he twisted it.  Past Medical History:  Diagnosis Date  . Medical history non-contributory     Patient Active Problem List   Diagnosis Date Noted  . GAD (generalized anxiety disorder) 05/28/2015  . Alcohol use disorder, severe, dependence (HCC) 05/26/2015    Past Surgical History:  Procedure Laterality Date  . TYMPANOSTOMY TUBE PLACEMENT          Home Medications    Prior to Admission medications   Medication Sig Start Date End Date Taking? Authorizing Provider  ibuprofen (ADVIL,MOTRIN) 200 MG tablet Take 800 mg by mouth every 6 (six) hours as needed for moderate pain.   Yes [provider]    Family History History reviewed. No pertinent family history.  Social History Social History   Tobacco Use  . Smoking status: Current Every Day Smoker    Packs/day: 1.00  . Smokeless tobacco: Never Used  Substance Use Topics  . Alcohol use: Yes    Comment: binge drinks  . Drug use: Yes    Types: Marijuana, Cocaine    Comment: used tonight     Allergies   Patient has no known allergies.   Review of Systems Review of Systems  Musculoskeletal: Positive for arthralgias.  All other systems reviewed and are negative.     Physical Exam Updated Vital Signs BP 119/88   Pulse 94   Temp 98.4 F (36.9 C) (Oral)   Resp 18   SpO2 98%   Physical Exam Vitals signs and nursing note reviewed.  Constitutional:      Appearance: He is well-developed.     Comments: Appears intoxicated, very loud  HENT:     Head: Normocephalic and atraumatic.  Eyes:     Conjunctiva/sclera: Conjunctivae normal.     Pupils: Pupils are equal, round, and reactive to light.  Neck:     Musculoskeletal: Normal range of motion.  Cardiovascular:     Rate and Rhythm: Normal rate and regular rhythm.     Heart sounds: Normal heart sounds.  Pulmonary:     Effort: Pulmonary effort is normal.     Breath sounds: Normal breath sounds.  Abdominal:     General: Bowel sounds are normal.     Palpations: Abdomen is soft.  Musculoskeletal: Normal range of motion.     Left knee: Tenderness found. Medial joint line tenderness noted.       Legs:     Comments: Tenderness along medial joint line, there is no significant swelling or deformity noted, no effusion present; able to flex/extend with some mild pain, DP pulses intact  Skin:  General: Skin is warm and dry.  Neurological:     Mental Status: He is alert and oriented to person, place, and time.      ED Treatments / Results  Labs (all labs ordered are listed, but only abnormal results are displayed) Labs Reviewed - No data to display  EKG    Radiology No results found.  Procedures Procedures (including critical care time)  Medications Ordered in ED Medications - No data to display   Initial Impression / Assessment and Plan / ED Course  I have reviewed the triage vital signs and the nursing notes.  Pertinent labs & imaging results that were available during my care of the patient were reviewed by me and considered in my medical decision making (see chart for details).  28 year old male here with left knee pain.  States this is an old injury but got "flared up" tonight when  he twisted his knee.  He does have EtOH on board.  Here he does appear intoxicated and breath smells of alcohol.  He is able to converse with me and give history.  Tenderness along the medial joint line of left knee but no bony deformities, significant swelling, or effusion noted.  His leg is neurovascularly intact.  Films are pending.  6:40 AM Awaiting formal read on x-rays, however patient states he needs to leave to go to a job interview at [employer].  On my overview, I do not appreciate any bony deformities.  Petra Kuba of his pain is likely ligamentous, probably MCL.  He was given knee sleeve and crutches.    Advised RICE routine, NSAIDs.  Orthopedic follow-up given. Return here for any new/acute changes.  Final Clinical Impressions(s) / ED Diagnoses   Final diagnoses:  Acute pain of left knee    ED Discharge Orders    None       Larene Pickett, PA-C 08/12/18 0701    Molpus, Jenny Reichmann, MD 08/12/18 [phone]

## 2018-08-12 NOTE — ED Notes (Signed)
Discharge summary given. Patient on the telephone with mother trying to get a ride home.

## 2018-08-12 NOTE — ED Triage Notes (Signed)
Pt reports twisting his knee tonight. He reports pain to the medial aspect of his L knee. ETOH.

## 2018-09-08 ENCOUNTER — Other Ambulatory Visit: Payer: Self-pay

## 2018-09-08 ENCOUNTER — Encounter (HOSPITAL_COMMUNITY): Payer: Self-pay | Admitting: Family Medicine

## 2018-09-08 ENCOUNTER — Emergency Department (HOSPITAL_COMMUNITY)
Admission: EM | Admit: 2018-09-08 | Discharge: 2018-09-08 | Discharge status: Against Medical Advice | Payer: Self-pay | Attending: Emergency Medicine | Admitting: Emergency Medicine

## 2018-09-08 DIAGNOSIS — F17210 Nicotine dependence, cigarettes, uncomplicated: Secondary | ICD-10-CM | POA: Insufficient documentation

## 2018-09-08 DIAGNOSIS — F10929 Alcohol use, unspecified with intoxication, unspecified: Secondary | ICD-10-CM | POA: Insufficient documentation

## 2018-09-08 DIAGNOSIS — F10920 Alcohol use, unspecified with intoxication, uncomplicated: Secondary | ICD-10-CM

## 2018-09-08 NOTE — ED Provider Notes (Signed)
[facility]-EMERGENCY DEPT Provider Note   CSN: [id number] Arrival date & time: 09/08/18  0241     History   Chief Complaint Chief Complaint  Patient presents with  . Depression    HPI Curtis Ross is a 28 y.o. male.     The history is provided by the patient and medical records.     28 year old male here acutely intoxicated.  Initially he checked and stating he needed detox and wanted to go behavioral health.  Halfway through triage he then decided he wanted to go home.  He tried to walk out to get in car but was stopped by [employer].  He is not complaining of any suicidal homicidal ideation.  Of note, I evaluated patient last month and he was intoxicated during that visit as well.  He has a documented history of alcohol dependence.  Past Medical History:  Diagnosis Date  . Medical history non-contributory     Patient Active Problem List   Diagnosis Date Noted  . GAD (generalized anxiety disorder) 05/28/2015  . Alcohol use disorder, severe, dependence (HCC) 05/26/2015    Past Surgical History:  Procedure Laterality Date  . TYMPANOSTOMY TUBE PLACEMENT          Home Medications    Prior to Admission medications   Medication Sig Start Date End Date Taking? Authorizing Provider  ibuprofen (ADVIL,MOTRIN) 200 MG tablet Take 800 mg by mouth every 6 (six) hours as needed for moderate pain.    [provider]    Family History History reviewed. No pertinent family history.  Social History Social History   Tobacco Use  . Smoking status: Current Every Day Smoker    Packs/day: 1.00  . Smokeless tobacco: Never Used  Substance Use Topics  . Alcohol use: Yes    Comment: Last drink: 2:45 am.   . Drug use: Yes    Types: Marijuana, Cocaine    Comment: Last used Marijuana: yesterday. Last used: Cocaine: 2 months ago      Allergies   Patient has no known allergies.   Review of Systems Review of Systems  Psychiatric/Behavioral:     EtOH  All other systems reviewed and are negative.    Physical Exam Updated Vital Signs BP 120/86 (BP Location: Right Arm)   Pulse (!) 122   Temp 98.3 F (36.8 C) (Oral)   Resp 18   Ht 5\' 10"  (1.778 m)   Wt 87.5 kg   SpO2 99%   BMI 27.69 kg/m   Physical Exam Vitals signs and nursing note reviewed.  Constitutional:      Appearance: He is well-developed.     Comments: Acutely intoxicated  HENT:     Head: Normocephalic and atraumatic.  Eyes:     Conjunctiva/sclera: Conjunctivae normal.     Pupils: Pupils are equal, round, and reactive to light.  Neck:     Musculoskeletal: Normal range of motion.  Cardiovascular:     Rate and Rhythm: Normal rate and regular rhythm.     Heart sounds: Normal heart sounds.  Pulmonary:     Effort: Pulmonary effort is normal. No respiratory distress.     Breath sounds: Normal breath sounds. No stridor.  Abdominal:     General: Bowel sounds are normal.     Palpations: Abdomen is soft.  Musculoskeletal: Normal range of motion.  Skin:    General: Skin is warm and dry.  Neurological:     Mental Status: He is alert and oriented to person,  place, and time.  Psychiatric:     Comments: Denies SI/HI      ED Treatments / Results  Labs (all labs ordered are listed, but only abnormal results are displayed) Labs Reviewed - No data to display  EKG None  Radiology No results found.  Procedures Procedures (including critical care time)  Medications Ordered in ED Medications - No data to display   Initial Impression / Assessment and Plan / ED Course  I have reviewed the triage vital signs and the nursing notes.  Pertinent labs & imaging results that were available during my care of the patient were reviewed by me and considered in my medical decision making (see chart for details).  28 y.o. M here acutely intoxicated.  Initially requested detox but half way through triage decided he wanted to leave.  No SI/HI.  Has documented history  of alcohol abuse. Patient's mother, Juliann Pulse, has been contacted and agrees to come pick him up.  Patient is aware, will wait in ED to ensure safe ride home.  Final Clinical Impressions(s) / ED Diagnoses   Final diagnoses:  Alcoholic intoxication without complication [facility])    ED Discharge Orders    None       Larene Pickett, PA-C 09/08/18 0402    Mesner, Corene Cornea, MD 09/08/18 [phone]

## 2018-09-08 NOTE — ED Triage Notes (Signed)
Patient is intoxicated from ETOH and is requesting detox from ETOH. He states "I need a bed at [facility]". Denies SI/HI.

## 2018-09-08 NOTE — ED Notes (Signed)
Patients mother came to pick patient up and patient was escorted out of facility to patients mothers car.

## 2018-09-08 NOTE — ED Notes (Signed)
While triaging patient he stated he wanted to go home. Informed patient could leave he wanted to since he was not suicidal and/or homicidal but I would need to contact a ride since he is intoxicated. Spoke with patients mother, Juliann Pulse, and she reported she would come pick up patient. Also, Lattie Haw, [place] is aware of patients condition and decision.

## 2018-10-11 ENCOUNTER — Other Ambulatory Visit: Payer: Self-pay

## 2018-10-11 DIAGNOSIS — Z20822 Contact with and (suspected) exposure to covid-19: Secondary | ICD-10-CM

## 2018-10-12 LAB — SPECIMEN STATUS REPORT

## 2018-10-12 LAB — NOVEL CORONAVIRUS, NAA: SARS-CoV-2, NAA: NOT DETECTED

## 2019-01-10 ENCOUNTER — Other Ambulatory Visit: Payer: Self-pay

## 2019-01-10 DIAGNOSIS — Z20822 Contact with and (suspected) exposure to covid-19: Secondary | ICD-10-CM

## 2019-01-12 LAB — NOVEL CORONAVIRUS, NAA: SARS-CoV-2, NAA: NOT DETECTED

## 2019-04-20 ENCOUNTER — Institutional Professional Consult (permissible substitution): Payer: Self-pay | Admitting: Family Medicine

## 2019-04-29 ENCOUNTER — Other Ambulatory Visit: Payer: Self-pay

## 2019-04-29 ENCOUNTER — Ambulatory Visit (INDEPENDENT_AMBULATORY_CARE_PROVIDER_SITE_OTHER): Payer: Self-pay | Admitting: Family Medicine

## 2019-04-29 ENCOUNTER — Encounter: Payer: Self-pay | Admitting: Family Medicine

## 2019-04-29 VITALS — Temp 97.4°F | Wt 185.0 lb

## 2019-04-29 DIAGNOSIS — F32A Depression, unspecified: Secondary | ICD-10-CM

## 2019-04-29 DIAGNOSIS — F329 Major depressive disorder, single episode, unspecified: Secondary | ICD-10-CM

## 2019-04-29 DIAGNOSIS — F419 Anxiety disorder, unspecified: Secondary | ICD-10-CM

## 2019-04-29 NOTE — Progress Notes (Signed)
   Subjective:    Patient ID: Curtis Ross, male    DOB: [birth year], 29 y.o.   MRN: [id number]  HPI Documentation for virtual telephone encounter.  Documentation for virtual audio and video telecommunications through [employer] encounter: The patient was located at home. The provider was located in the office. The patient did consent to this visit and is aware of possible charges through their insurance for this visit. The other persons participating in this telemedicine service were none. This virtual service is not related to other E/M service within previous 7 days. He called and is interested in getting involved in counseling to help deal with anxiety and depression.  He has for my thoughts on psychiatrists  Review of Systems     Objective:   Physical Exam Alert and appearing in no distress with appropriate affect.      Assessment & Plan:  Anxiety and depression Recommend that he call [facility].  He was also going to set up for a complete exam.

## 2019-05-24 ENCOUNTER — Ambulatory Visit (INDEPENDENT_AMBULATORY_CARE_PROVIDER_SITE_OTHER): Payer: Self-pay | Admitting: Family Medicine

## 2019-05-24 ENCOUNTER — Other Ambulatory Visit: Payer: Self-pay

## 2019-05-24 ENCOUNTER — Encounter: Payer: Self-pay | Admitting: Family Medicine

## 2019-05-24 VITALS — BP 142/90 | HR 103 | Temp 97.7°F | Ht 69.0 in | Wt 183.8 lb

## 2019-05-24 DIAGNOSIS — F32A Depression, unspecified: Secondary | ICD-10-CM

## 2019-05-24 DIAGNOSIS — F419 Anxiety disorder, unspecified: Secondary | ICD-10-CM

## 2019-05-24 DIAGNOSIS — F17200 Nicotine dependence, unspecified, uncomplicated: Secondary | ICD-10-CM

## 2019-05-24 DIAGNOSIS — F329 Major depressive disorder, single episode, unspecified: Secondary | ICD-10-CM

## 2019-05-24 DIAGNOSIS — F1020 Alcohol dependence, uncomplicated: Secondary | ICD-10-CM

## 2019-05-24 NOTE — Progress Notes (Signed)
   Subjective:    Patient ID: Curtis Ross, male    DOB: 01-12-1991, 29 y.o.   MRN: [id number]  HPI He is here for an interval evaluation.  He mainly is interested in his liver function as he does drink up to 6-7 beers per day.  He also smokes roughly 3/4 of a pack per day.  He does not use street drugs or smoke marijuana.  Does have a previous history of difficulty with anxiety/depression but has not gotten appropriate follow-up on that.  He apparently is set up to see a psychologist but is not sure when.  He is presently unemployed, is married but planning on getting a divorce.   Review of Systems     Objective:   Physical Exam Alert and in no distress. Tympanic membranes and canals are normal. Pharyngeal area is normal. Neck is supple without adenopathy or thyromegaly. Cardiac exam shows a regular sinus rhythm without murmurs or gallops. Lungs are clear to auscultation.        Assessment & Plan:  Anxiety and depression - Plan: Lipid Panel, CBC with Differential, Comprehensive metabolic panel  Alcohol use disorder, severe, dependence (HCC) - Plan: Lipid Panel, CBC with Differential, Comprehensive metabolic panel  Smoker Recommend that he call family and children services to get involved in counseling. Also discussed the need for him to stop all alcohol consumption and quit smoking preferably cutting the alcohol out first.

## 2019-05-25 LAB — COMPREHENSIVE METABOLIC PANEL
ALT: 31 IU/L (ref 0–44)
AST: 17 IU/L (ref 0–40)
Albumin/Globulin Ratio: 2 (ref 1.2–2.2)
Albumin: 5.1 g/dL (ref 4.1–5.2)
Alkaline Phosphatase: 68 IU/L (ref 39–117)
BUN/Creatinine Ratio: 8 — ABNORMAL LOW (ref 9–20)
BUN: 8 mg/dL (ref 6–20)
Bilirubin Total: 0.7 mg/dL (ref 0.0–1.2)
CO2: 19 mmol/L — ABNORMAL LOW (ref 20–29)
Calcium: 9.5 mg/dL (ref 8.7–10.2)
Chloride: 100 mmol/L (ref 96–106)
Creatinine, Ser: 0.96 mg/dL (ref 0.76–1.27)
GFR calc Af Amer: 125 mL/min/{1.73_m2} (ref >59)
GFR calc non Af Amer: 108 mL/min/{1.73_m2} (ref >59)
Globulin, Total: 2.6 g/dL (ref 1.5–4.5)
Glucose: 85 mg/dL (ref 65–99)
Potassium: 3.8 mmol/L (ref 3.5–5.2)
Sodium: 140 mmol/L (ref 134–144)
Total Protein: 7.7 g/dL (ref 6.0–8.5)

## 2019-05-25 LAB — CBC WITH DIFFERENTIAL/PLATELET
Basophils Absolute: 0 10*3/uL (ref 0.0–0.2)
Basos: 0 %
EOS (ABSOLUTE): 0 10*3/uL (ref 0.0–0.4)
Eos: 0 %
Hematocrit: 48 % (ref 37.5–51.0)
Hemoglobin: 16.9 g/dL (ref 13.0–17.7)
Immature Grans (Abs): 0 10*3/uL (ref 0.0–0.1)
Immature Granulocytes: 0 %
Lymphocytes Absolute: 2.5 10*3/uL (ref 0.7–3.1)
Lymphs: 24 %
MCH: 33.2 pg — ABNORMAL HIGH (ref 26.6–33.0)
MCHC: 35.2 g/dL (ref 31.5–35.7)
MCV: 94 fL (ref 79–97)
Monocytes Absolute: 1.1 10*3/uL — ABNORMAL HIGH (ref 0.1–0.9)
Monocytes: 10 %
Neutrophils Absolute: 7.1 10*3/uL — ABNORMAL HIGH (ref 1.4–7.0)
Neutrophils: 66 %
Platelets: 237 10*3/uL (ref 150–450)
RBC: 5.09 x10E6/uL (ref 4.14–5.80)
RDW: 12.8 % (ref 11.6–15.4)
WBC: 10.7 10*3/uL (ref 3.4–10.8)

## 2019-05-25 LAB — LIPID PANEL
Chol/HDL Ratio: 3.6 ratio (ref 0.0–5.0)
Cholesterol, Total: 174 mg/dL (ref 100–199)
HDL: 48 mg/dL (ref >39)
LDL Chol Calc (NIH): 101 mg/dL — ABNORMAL HIGH (ref 0–99)
Triglycerides: 141 mg/dL (ref 0–149)
VLDL Cholesterol Cal: 25 mg/dL (ref 5–40)

## 2019-10-05 ENCOUNTER — Ambulatory Visit: Payer: Self-pay | Admitting: Family Medicine

## 2020-04-23 ENCOUNTER — Emergency Department (HOSPITAL_COMMUNITY)
Admission: EM | Admit: 2020-04-23 | Discharge: 2020-04-24 | Disposition: A | Discharge status: Home / Self Care | Payer: Self-pay | Attending: Emergency Medicine | Admitting: Emergency Medicine

## 2020-04-23 ENCOUNTER — Encounter (HOSPITAL_COMMUNITY): Payer: Self-pay

## 2020-04-23 ENCOUNTER — Other Ambulatory Visit: Payer: Self-pay

## 2020-04-23 DIAGNOSIS — F17210 Nicotine dependence, cigarettes, uncomplicated: Secondary | ICD-10-CM | POA: Insufficient documentation

## 2020-04-23 DIAGNOSIS — Y907 Blood alcohol level of 200-239 mg/100 ml: Secondary | ICD-10-CM | POA: Insufficient documentation

## 2020-04-23 DIAGNOSIS — F419 Anxiety disorder, unspecified: Secondary | ICD-10-CM | POA: Insufficient documentation

## 2020-04-23 DIAGNOSIS — F1020 Alcohol dependence, uncomplicated: Secondary | ICD-10-CM | POA: Insufficient documentation

## 2020-04-23 DIAGNOSIS — Z20822 Contact with and (suspected) exposure to covid-19: Secondary | ICD-10-CM | POA: Insufficient documentation

## 2020-04-23 DIAGNOSIS — F332 Major depressive disorder, recurrent severe without psychotic features: Secondary | ICD-10-CM | POA: Insufficient documentation

## 2020-04-23 DIAGNOSIS — R45851 Suicidal ideations: Secondary | ICD-10-CM | POA: Insufficient documentation

## 2020-04-23 LAB — COMPREHENSIVE METABOLIC PANEL
ALT: 32 U/L (ref 0–44)
AST: 25 U/L (ref 15–41)
Albumin: 4.5 g/dL (ref 3.5–5.0)
Alkaline Phosphatase: 66 U/L (ref 38–126)
Anion gap: 18 — ABNORMAL HIGH (ref 5–15)
BUN: 10 mg/dL (ref 6–20)
CO2: 19 mmol/L — ABNORMAL LOW (ref 22–32)
Calcium: 8.8 mg/dL — ABNORMAL LOW (ref 8.9–10.3)
Chloride: 101 mmol/L (ref 98–111)
Creatinine, Ser: 0.93 mg/dL (ref 0.61–1.24)
GFR, Estimated: >60 mL/min (ref >60)
Glucose, Bld: 133 mg/dL — ABNORMAL HIGH (ref 70–99)
Potassium: 3.4 mmol/L — ABNORMAL LOW (ref 3.5–5.1)
Sodium: 138 mmol/L (ref 135–145)
Total Bilirubin: 1 mg/dL (ref 0.3–1.2)
Total Protein: 7.9 g/dL (ref 6.5–8.1)

## 2020-04-23 LAB — ACETAMINOPHEN LEVEL: Acetaminophen (Tylenol), Serum: <10 ug/mL — ABNORMAL LOW (ref 10–30)

## 2020-04-23 LAB — CBC
HCT: 48.2 % (ref 39.0–52.0)
Hemoglobin: 17.8 g/dL — ABNORMAL HIGH (ref 13.0–17.0)
MCH: 33.7 pg (ref 26.0–34.0)
MCHC: 36.9 g/dL — ABNORMAL HIGH (ref 30.0–36.0)
MCV: 91.3 fL (ref 80.0–100.0)
Platelets: 283 10*3/uL (ref 150–400)
RBC: 5.28 MIL/uL (ref 4.22–5.81)
RDW: 11.9 % (ref 11.5–15.5)
WBC: 6.2 10*3/uL (ref 4.0–10.5)
nRBC: 0 % (ref 0.0–0.2)

## 2020-04-23 LAB — SALICYLATE LEVEL: Salicylate Lvl: <7 mg/dL — ABNORMAL LOW (ref 7.0–30.0)

## 2020-04-23 LAB — RESP PANEL BY RT-PCR (FLU A&B, COVID) ARPGX2
Influenza A by PCR: NEGATIVE
Influenza B by PCR: NEGATIVE
SARS Coronavirus 2 by RT PCR: NEGATIVE

## 2020-04-23 LAB — RAPID URINE DRUG SCREEN, HOSP PERFORMED
Amphetamines: NOT DETECTED
Barbiturates: NOT DETECTED
Benzodiazepines: NOT DETECTED
Cocaine: NOT DETECTED
Opiates: NOT DETECTED
Tetrahydrocannabinol: POSITIVE — AB

## 2020-04-23 LAB — ETHANOL: Alcohol, Ethyl (B): 227 mg/dL — ABNORMAL HIGH (ref <10)

## 2020-04-23 NOTE — ED Provider Notes (Signed)
[facility]-EMERGENCY DEPT Provider Note   CSN: [id number] Arrival date & time: 04/23/20  2110     History Chief Complaint  Patient presents with  . Suicidal    Curtis Ross is a 30 y.o. male.  The history is provided by the patient and medical records.   30 y.o. F with hx of anxiety, alcohol abuse disorder, presenting to the ED for suicidal ideation.  Patient states lately he has felt increasingly depressed, helpless, not wanting to get out of bed, no will to go on, etc.  States he is not sure exactly why this has gotten worse, no acute event/change that he can think of.  He has had issues with this for quite some time but feels like he used to manage this well on his own.  He has had suicidal thoughts but denies any specific plan currently.  He does admit to increased alcohol use recently-- states it is not daily but he will binge for a few days.  He has had some legal issues with this, recent DUI.  He is interested in treatment but with his depression/suicidal thoughts he was not sure what kind he needed.  He states he has never done formal detox but has been going to AA meeting sporadically.  Past Medical History:  Diagnosis Date  . Medical history non-contributory     Patient Active Problem List   Diagnosis Date Noted  . GAD (generalized anxiety disorder) 05/28/2015  . Alcohol use disorder, severe, dependence (HCC) 05/26/2015    Past Surgical History:  Procedure Laterality Date  . TYMPANOSTOMY TUBE PLACEMENT         No family history on file.  Social History   Tobacco Use  . Smoking status: Current Every Day Smoker    Packs/day: 1.00  . Smokeless tobacco: Never Used  Vaping Use  . Vaping Use: Never used  Substance Use Topics  . Alcohol use: Yes    Comment: Last drink: 2:45 am.   . Drug use: Yes    Types: Marijuana, Cocaine    Comment: Last used Marijuana: yesterday. Last used: Cocaine: 2 months ago     Home Medications Prior to  Admission medications   Medication Sig Start Date End Date Taking? Authorizing Provider  ibuprofen (ADVIL,MOTRIN) 200 MG tablet Take 800 mg by mouth every 6 (six) hours as needed for moderate pain.    [provider]    Allergies    Patient has no known allergies.  Review of Systems   Review of Systems  Psychiatric/Behavioral: Positive for suicidal ideas.  All other systems reviewed and are negative.   Physical Exam Updated Vital Signs BP (!) 141/98   Pulse (!) 143   Temp 98.3 F (36.8 C) (Oral)   Resp 18   Ht 5\' 9"  (1.753 m)   Wt 85 kg   SpO2 100%   BMI 27.67 kg/m   Physical Exam Vitals and nursing note reviewed.  Constitutional:      Appearance: He is well-developed and well-nourished.  HENT:     Head: Normocephalic and atraumatic.     Mouth/Throat:     Mouth: Oropharynx is clear and moist.  Eyes:     Extraocular Movements: EOM normal.     Conjunctiva/sclera: Conjunctivae normal.     Pupils: Pupils are equal, round, and reactive to light.  Cardiovascular:     Rate and Rhythm: Normal rate and regular rhythm.     Heart sounds: Normal heart sounds.  Pulmonary:  Effort: Pulmonary effort is normal.     Breath sounds: Normal breath sounds.  Abdominal:     General: Bowel sounds are normal.     Palpations: Abdomen is soft.  Musculoskeletal:        General: Normal range of motion.     Cervical back: Normal range of motion.  Skin:    General: Skin is warm and dry.  Neurological:     Mental Status: He is alert and oriented to person, place, and time.  Psychiatric:        Mood and Affect: Mood and affect normal.     Comments: Passive SI without plan Seems somewhat intoxicated, staring at the floor     ED Results / Procedures / Treatments   Labs (all labs ordered are listed, but only abnormal results are displayed) Labs Reviewed  COMPREHENSIVE METABOLIC PANEL - Abnormal; Notable for the following components:      Result Value   Potassium 3.4 (*)     CO2 19 (*)    Glucose, Bld 133 (*)    Calcium 8.8 (*)    Anion gap 18 (*)    All other components within normal limits  ETHANOL - Abnormal; Notable for the following components:   Alcohol, Ethyl (B) 227 (*)    All other components within normal limits  SALICYLATE LEVEL - Abnormal; Notable for the following components:   Salicylate Lvl <7.0 (*)    All other components within normal limits  ACETAMINOPHEN LEVEL - Abnormal; Notable for the following components:   Acetaminophen (Tylenol), Serum <10 (*)    All other components within normal limits  CBC - Abnormal; Notable for the following components:   Hemoglobin 17.8 (*)    MCHC 36.9 (*)    All other components within normal limits  RAPID URINE DRUG SCREEN, HOSP PERFORMED - Abnormal; Notable for the following components:   Tetrahydrocannabinol POSITIVE (*)    All other components within normal limits  RESP PANEL BY RT-PCR (FLU A&B, COVID) ARPGX2    EKG None  Radiology No results found.  Procedures Procedures   Medications Ordered in ED Medications - No data to display  ED Course  I have reviewed the triage vital signs and the nursing notes.  Pertinent labs & imaging results that were available during my care of the patient were reviewed by me and considered in my medical decision making (see chart for details).    MDM Rules/Calculators/A&P  30 y.o. M with hx of alcohol abuse here with SI.  States he knows he needs help, not sure what to do.  Admits to hopelessness and feeling inadequate.  No active plan of suicide.  Has been on a bender recently, drank just PTA.  Labs as above-- minimally elevated anion gap which I suspect is due to his alcohol abuse.  Ethanol 227 currently.  UDS + for THC.  Medically cleared, TTS to evaluate.  At change of shift, TTS evaluation still pending. Day shift to follow-up on recommendations.  Final Clinical Impression(s) / ED Diagnoses Final diagnoses:  Suicidal ideation    Rx / DC  Orders ED Discharge Orders    None       Garlon Hatchet, PA-C 04/25/20 0249    Palumbo, April, MD 04/26/20 2319

## 2020-04-23 NOTE — ED Triage Notes (Signed)
Pt presents with c/o suicidal ideation with no specific plan. Pt reports a generalized feeling of hopelessness. Admits to drinking "3 FourLoko" prior to arrival.

## 2020-04-24 NOTE — Discharge Instructions (Signed)
For your behavioral health needs you are advised to follow up with [facility]:       [facility]      [address]      [place], [place] [address]      [phone]      They offer psychiatry/medication management, therapy and substance abuse treatment.  New patients are seen in their walk-in clinic.  Walk-in hours are Monday - Thursday from 8:00 am - 11:00 am for psychiatry, and Friday from 1:00 pm - 4:00 pm for therapy.  Walk-in patients are seen on a first come, first served basis, so try to arrive as early as possible for the best chance of being seen the same day.

## 2020-04-24 NOTE — ED Notes (Signed)
Pt alert, calm and cooperative at this time. Denies HI, when asked about whether pt wanted to hurt himself, pt reports "No, not really."  Pt AOx4.

## 2020-04-24 NOTE — BH Assessment (Addendum)
Patient is psych cleared, per (provider) Assunta Found, NP. Patient recommended to follow up with referrals as noted below.  Requested Disposition Counselor Doylene Canning) to provide patient with information for services at the [facility]. Patient informed that he will need to schedule an appointment for outpatient  therapy and medication management. Clinician has requested the Disposition Counselor to provide patient with open access information as well.   He has some substance use issues. Disposition Counselor informed that patient will need resources for this too. However, patient is not interested in immediate substance use services at this time. He will still accept any substance use resources provided. States that his  lawyer is sending him to court appointmented drug classes's for his upcoming court date in April 2022. Patient feel that this this will address his immediate concerns as it relates to his substance use issues.  Patient's nurse and EDP provided updates.

## 2020-04-24 NOTE — BH Assessment (Signed)
[facility] Assessment Progress Note  Per Shuvon Rankin, NP, this voluntary pt does not require psychiatric hospitalization at this time.  Pt is psychiatrically cleared.  Discharge instructions advise pt to follow up with [facility].  EDP Lorre Nick, MD and pt's nurse, Carolynne, have been notified.  Doylene Canning, MA Triage Specialist [phone]

## 2020-04-24 NOTE — ED Notes (Signed)
TTS monitor placed at bedside 

## 2020-04-24 NOTE — BH Assessment (Addendum)
Comprehensive Clinical Assessment (CCA) Note    Curtis Ross is a 30 y.o. male. States that he was at a friends house last night with plans to stay overnight. He did go to this friends house. However, ended up drinking and discussing with that friend how depressed and anxious he has felt for the past 7 months. Also, reported that he has been  binge drinking to manage his symptoms of depression and anxiety. States that after drinking a large amount of alcohol and receiving support from friends he [facility].   Patient denies current suicidal ideaions. He reports fleeting and instrusive thoughts of sucide x1 month ago. No thoughts of suicide since this reported period of time. Nor as he every thought of a suicide plan.   His major stressor at this time are legal issues. He is currently on  probation for a DUI that he obtained January 2020.  Also, work stress. 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 The environment is always caotic. Therefore, his job increases his anxiety symptoms. Current depressive symptoms include isolating self from others, irritability, guilt, and loss of interest in usual pleasure. He sleeps 7-8 hrs per night. Appetite is good. No significant weight and/or loss. Denies history of trauma and/or abuse. He has a family history of depression (sister). Patient's support system are the members of AA meetings that he attends and his parents. He currently lives in a household with parents. Highest level of education consist of some college. Patient denies homicidal ideations. Denies history of aggressive and/or assaultive behaviors. States that his only legal issue is the DUI that he obtained, January 2020. His court date is 06/20/2020. He is currently on probation at this time. Denies AVH's.   Patient started drinking at the age of 30 yrs old. He reports  experiencing drinking binges 2-3 times per week for the past 3 years to manage his depression/anxiety as noted above. He drinks approximately #22 drinks (malt liquor or beer) over the course of 2-3 day periods. Last drink was 04/23/2020 and he drank #12 Malt Liquors. Denies current withdrawal symptoms. No history of seizures and/or DT's. Denies drug use. He has participated in [place] and "alcohol drug classes intermittently for several years. He has a family history of substance use (paternal grandmother).   Patient denies a history of inpatient psychiatric treatment. However, upon chat review he was [facility] for depression and suicidal ideations.  Also, denies a history of outpatient therapy. He is not prescribed any psychiatric medications or seeing a psychiatrist at this time.   Disposition:  Patient is psych cleared, per (provider) Assunta FoundShuvon Rankin, NP. Patient recommended to follow up with referrals as noted below.  Requested Disposition Counselor Curtis Ross(Curtis Ross) to provide patient with information for services [facility]. Patient informed that he will need to schedule an appointment for outpatient  therapy and medication management. Clinician has requested the Disposition Counselor to provide patient with open access information as well.   He has some substance use issues. Disposition Counselor informed that patient will need resources for this too. However, patient is not interested in immediate substance use services at this time. He will still accept any substance use resources provided. States that his  lawyer is sending him to court appointmented drug classes's for his upcoming court date in April 2022. Patient feel that this this will address his  immediate concerns as it relates to his substance use issues.     Patient's nurse and EDP provided updates.    04/24/2020 Curtis Ross [id number]  Chief Complaint:  Chief Complaint  Patient presents with  . Suicidal   Visit  Diagnosis: Major Depressive Disorder, Recurrent, Severe, without psychotic features; Anxiety Disorder; Alcohol Use Disorder, Severe   CCA Screening, Triage and Referral (STR)  Patient Reported Information How did you hear about [place]? Family/Friend  Referral name: Friend  Referral phone number: No data recorded  Whom do you see for routine medical problems? Primary Care  Practice/Facility Name: Ronnald Nian, MD/[facility]  [address]  [place],  [place]  [address]  Practice/Facility Phone Number: [phone])  Name of Contact: Ronnald Nian, MD/[facility]  [address]  [place],  [place]  [address]  Contact Number: [phone]  Contact Fax Number: No data recorded Prescriber Name: Ronnald Nian, MD/[facility]  [address]  [place],  [place]  [address]  Prescriber Address (if known): Ronnald Nian, MD/[facility]  [address]  [place],  [place]  [address]   What Is the Reason for Your Visit/Call Today? depression, anxiety, and substance use treatment.  How Long Has This Been Causing You Problems? > than 6 months (6-7 years)  What Do You Feel Would Help You the Most Today? Assessment Only; Therapy; Medication   Have You Recently Been in Any Inpatient Treatment (Hospital/Detox/Crisis Center/28-Day Program)? No  Name/Location of Program/Hospital:No data recorded How Long Were You There? No data recorded When Were You Discharged? No data recorded  Have You Ever Received Services From [facility] Before? No  Who Do You See at [facility]? No data recorded  Have You Recently Had Any Thoughts About Hurting Yourself? No  Are You Planning to Commit Suicide/Harm Yourself At This time? No   Have you Recently Had Thoughts About Hurting Someone Karolee Ohs? No  Explanation: No data recorded  Have You Used Any Alcohol or Drugs in the Past 24 Hours? No  How Long Ago Did You Use Drugs or Alcohol?  No data recorded What Did You Use and How Much? No data recorded  Do You Currently Have a Therapist/Psychiatrist? No  Name of Therapist/Psychiatrist: No data recorded  Have You Been Recently Discharged From Any Office Practice or Programs? No  Explanation of Discharge From Practice/Program: No data recorded    CCA Screening Triage Referral Assessment Type of Contact: Tele-Assessment  Is this Initial or Reassessment? Initial Assessment  Date Telepsych consult ordered in [facility]:  04/24/2020  Time Telepsych consult ordered in [facility]:  No data recorded  Patient Reported Information Reviewed? No  Patient Left Without Being Seen? No data recorded Reason for Not Completing Assessment: No data recorded  Collateral Involvement: No data recorded  Does Patient Have a Court Appointed Legal Guardian? No data recorded Name and Contact of Legal Guardian: No data recorded If Minor and Not Living with Parent(s), Who has Custody? No data recorded Is CPS involved or ever been involved? Never  Is APS involved or ever been involved? Never   Patient Determined To Be At Risk for Harm To Self or Others Based on Review of Patient Reported Information or Presenting Complaint? No  Method: No data recorded Availability of Means: No data recorded Intent: No data recorded Notification Required: No data recorded Additional Information for Danger to Others Potential: No data recorded Additional Comments for Danger to Others Potential: No data recorded Are  There Guns or Other Weapons in Your Home? No data recorded Types of Guns/Weapons: No data recorded Are These Weapons Safely Secured?                            No data recorded Who Could Verify You Are Able To Have These Secured: No data recorded Do You Have any Outstanding Charges, Pending Court Dates, Parole/Probation? No data recorded Contacted To Inform of Risk of Harm To Self or Others: No data recorded  [facility]   Does  Patient Present under Involuntary Commitment? No  IVC Papers Initial File Date: No data recorded  [place] of Residence: [place]   Patient Currently Receiving the Following Services: -- (no services in place at this time.)   Determination of Need: Urgent (48 hours)   Options For Referral: Medication Management; Chemical Dependency Intensive Outpatient Therapy (CDIOP); Outpatient Therapy     CCA Biopsychosocial Intake/Chief Complaint:  30 y.o. F with hx of anxiety, alcohol abuse disorder, presenting to the ED for suicidal ideation.  Patient states lately he has felt increasingly depressed, helpless, not wanting to get out of bed, no will to go on, etc.  States he is not sure exactly why this has gotten worse, no acute event/change that he can think of.  He has had issues with this for quite some time but feels like he used to manage this well on his own.  He has had suicidal thoughts but denies any specific plan currently.  He does admit to increased alcohol use recently-- states it is not daily but he will binge for a few days.  He has had some legal issues with this, recent DUI.  He is interested in treatment but with his depression/suicidal thoughts he was not sure what kind he needed.  He states he has never done formal detox but has been going to AA meeting sporadically.  Current Symptoms/Problems: Depressive symptoms include isolating self from others, irritability, guilt, and loss of interest in usual pleasure   Patient Reported Schizophrenia/Schizoaffective Diagnosis in Past: No   Strengths: request help when needed  Preferences: outpatient therapy and medication management to help with his depresion and anxiety  Abilities: varies   Type of Services Patient Feels are Needed: out   Initial Clinical Notes/Concerns: depression   Mental Health Symptoms Depression:  Change in energy/activity; Difficulty Concentrating; Irritability   Duration of Depressive symptoms: Greater  than two weeks   Mania:  Irritability   Anxiety:   Irritability; Tension   Psychosis:  None   Duration of Psychotic symptoms: No data recorded  Trauma:  None   Obsessions:  None   Compulsions:  None   Inattention:  None   Hyperactivity/Impulsivity:  N/A   Oppositional/Defiant Behaviors:  None   Emotional Irregularity:  None   Other Mood/Personality Symptoms:  depresion    Mental Status Exam Appearance and self-care  Stature:  Average   Weight:  Average weight   Clothing:  Age-appropriate   Grooming:  Normal   Cosmetic use:  None   Posture/gait:  Normal   Motor activity:  Not Remarkable   Sensorium  Attention:  Normal   Concentration:  Normal   Orientation:  X5   Recall/memory:  Normal   Affect and Mood  Affect:  Appropriate   Mood:  Depressed   Relating  Eye contact:  Normal   Facial expression:  Depressed   Attitude toward examiner:  Cooperative  Thought and Language  Speech flow: Clear and Coherent   Thought content:  Appropriate to Mood and Circumstances   Preoccupation:  None   Hallucinations:  None   Organization:  No data recorded  [employer] of Knowledge:  Average   Intelligence:  Average   Abstraction:  Abstract   Judgement:  Normal   Reality Testing:  Adequate   Insight:  Lacking   Decision Making:  Normal   Social Functioning  Social Maturity:  Isolates   Social Judgement:  Normal   Stress  Stressors:  Relationship   Coping Ability:  Normal   Skill Deficits:  Responsibility   Supports:  Family     Religion: Religion/Spirituality Are You A Religious Person?:  (unk)  Leisure/Recreation: Leisure / Recreation Do You Have Hobbies?: No  Exercise/Diet: Exercise/Diet Do You Exercise?: No Have You Gained or Lost A Significant Amount of Weight in the Past Six Months?: No Do You Follow a Special Diet?: No Do You Have Any Trouble Sleeping?: No Explanation of Sleeping Difficulties: 7-8  hrs per night   CCA Employment/Education Employment/Work Situation: Employment / Work Situation Employment situation: Employed 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 How long has patient been employed?: 5 months Patient's job has been impacted by current illness: Yes Describe how patient's job has been impacted: job demands increase anxiety What is the longest time patient has a held a job?: 4 yrs 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 Has patient ever been in the [employer]?: No  Education: Education Is Patient Currently Attending School?: No Last Grade Completed:  (some college) Name of [employer] School: Graduated from [employer] Did [place] Graduate From [employer]?: Yes Did Theme park manager?: Yes What Type of College Degree Do you Have?: some college Did Designer, television/film set?: No What Was Your Major?: Gaffer Program Did You Have Any Scientist, research (life sciences) In School?: none reported Did You Have An Individualized Education Program (IIEP): No Did You Have Any Difficulty At [employer]?: No Patient's Education Has Been Impacted by Current Illness: No   CCA Family/Childhood History Family and Relationship History: Family history Marital status: Separated Separated, when?: April 2020 What types of issues is patient dealing with in the relationship?: Patient states that he doesn't speak to her. "We are going to just file for divorce and be done with it" Additional relationship information: none reported Are you sexually active?:  (unk) What is your sexual orientation?: hetero sexual Has your sexual activity been affected by drugs, alcohol, medication, or emotional stress?: unk Does patient have children?: No  Childhood History:  Childhood History By whom was/is the patient raised?: Both parents Additional childhood history information: none reported Description of patient's  relationship with caregiver when they were a child: Patient states that he has a good relationship with parents for the most part. States that when he drinks they have experienced problems but from day to day they get alone prettty well. Patient's description of current relationship with people who raised him/her: Good relationship How were you disciplined when you got in trouble as a child/adolescent?: "I was gounded for whatever amount of time they saw fit" Does patient have siblings?: No (1/2 brother and 1/2 sister: "We are all pretty close") Did patient suffer from severe childhood neglect?: No Has patient ever been sexually abused/assaulted/raped as an adolescent or adult?: No Was the patient ever a victim of a crime or a disaster?: No Witnessed domestic violence?:  No Has patient been affected by domestic violence as an adult?: No  Child/Adolescent Assessment:     CCA Substance Use Alcohol/Drug Use: Alcohol / Drug Use Pain Medications: SEE MAR Prescriptions: SEE MAR Over the Counter: SEE MAR History of alcohol / drug use?: Yes Longest period of sobriety (when/how long): "a couple of months" -last year (16109) Negative Consequences of Use: Financial,Legal,Personal relationships,Work / School Withdrawal Symptoms: Patient aware of relationship between substance abuse and physical/medical complications (Patient denies withdrawal symptoms at this time.) Substance #1 Name of Substance 1: Alcohol 1 - Age of First Use: 30 yrs old 1 - Amount (size/oz): binge drink; aproximately 22 drinks (malt liqour or beer) over the course of 2-3 days 1 - Frequency: variees; 2-3 days per week 1 - Duration: 3 yrs 1 - Last Use / Amount: 04/23/2020; patient states that he had #12 malt liquors 1 - Method of Aquiring: purchases from any store that sells alcohol 1- Route of Use: oral                       ASAM's:  Six Dimensions of Multidimensional Assessment  Dimension 1:  Acute Intoxication  and/or Withdrawal Potential:      Dimension 2:  Biomedical Conditions and Complications:      Dimension 3:  Emotional, Behavioral, or Cognitive Conditions and Complications:     Dimension 4:  Readiness to Change:     Dimension 5:  Relapse, Continued use, or Continued Problem Potential:     Dimension 6:  Recovery/Living Environment:     ASAM Severity Score:    ASAM Recommended Level of Treatment:     Substance use Disorder (SUD)    Recommendations for Services/Supports/Treatments:    DSM5 Diagnoses: Patient Active Problem List   Diagnosis Date Noted  . GAD (generalized anxiety disorder) 05/28/2015  . Alcohol use disorder, severe, dependence (HCC) 05/26/2015    Patient Centered Plan: Patient is on the following Treatment Plan(s):  Anxiety, Depression and Substance Abuse   Referrals to Alternative Service(s): Referred to Alternative Service(s):   Place:   Date:   Time:    Referred to Alternative Service(s):   Place:   Date:   Time:    Referred to Alternative Service(s):   Place:   Date:   Time:    Referred to Alternative Service(s):   Place:   Date:   Time:     Melynda Ripple, CounselorComprehensive Clinical Assessment (CCA) Screening, Triage and Referral Note  04/24/2020 Curtis Ross [id number]  Chief Complaint:  Chief Complaint  Patient presents with  . Suicidal   Visit Diagnosis:  Major Depressive Disorder, Recurrent, Severe, without psychotic features; Anxiety Disorder; Alcohol Use Disorder, Severe   Patient Reported Information How did you hear about [place]? Family/Friend   Referral name: Friend   Referral phone number: No data recorded Whom do you see for routine medical problems? Primary Care   Practice/Facility Name: Ronnald Nian, MD/[facility]  [address]  [place],  [place]  [address]   Practice/Facility Phone Number: [phone])   Name of Contact: Ronnald Nian, MD/[facility]  [address]   [place],  [place]  [address]   Contact Number: [phone]   Contact Fax Number: No data recorded  Prescriber Name: Ronnald Nian, MD/[facility]  [address]  [place],  [place]  [address]   Prescriber Address (if known): Ronnald Nian, MD/[facility]  [address]  [place],  [place]  [address]  What Is the Reason for Your Visit/Call Today? depression, anxiety, and substance use treatment.  How Long Has This Been Causing You Problems? > than 6 months (6-7 years)  Have You Recently Been in Any Inpatient Treatment (Hospital/Detox/Crisis Center/28-Day Program)? No   Name/Location of Program/Hospital:No data recorded  How Long Were You There? No data recorded  When Were You Discharged? No data recorded Have You Ever Received Services From [facility] Before? No   Who Do You See at [facility]? No data recorded Have You Recently Had Any Thoughts About Hurting Yourself? No   Are You Planning to Commit Suicide/Harm Yourself At This time?  No  Have you Recently Had Thoughts About Hurting Someone Karolee Ohs? No   Explanation: No data recorded Have You Used Any Alcohol or Drugs in the Past 24 Hours? No   How Long Ago Did You Use Drugs or Alcohol?  No data recorded  What Did You Use and How Much? No data recorded What Do You Feel Would Help You the Most Today? Assessment Only; Therapy; Medication  Do You Currently Have a Therapist/Psychiatrist? No   Name of Therapist/Psychiatrist: No data recorded  Have You Been Recently Discharged From Any Office Practice or Programs? No   Explanation of Discharge From Practice/Program:  No data recorded    CCA Screening Triage Referral Assessment Type of Contact: Tele-Assessment   Is this Initial or Reassessment? Initial Assessment   Date Telepsych consult ordered in [facility]:  04/24/2020   Time Telepsych consult ordered in [facility]:  No data recorded Patient Reported Information Reviewed? No   Patient Left Without Being  Seen? No data recorded  Reason for Not Completing Assessment: No data recorded Collateral Involvement: No data recorded Does Patient Have a Court Appointed Legal Guardian? No data recorded  Name and Contact of Legal Guardian:  No data recorded If Minor and Not Living with Parent(s), Who has Custody? No data recorded Is CPS involved or ever been involved? Never  Is APS involved or ever been involved? Never  Patient Determined To Be At Risk for Harm To Self or Others Based on Review of Patient Reported Information or Presenting Complaint? No   Method: No data recorded  Availability of Means: No data recorded  Intent: No data recorded  Notification Required: No data recorded  Additional Information for Danger to Others Potential:  No data recorded  Additional Comments for Danger to Others Potential:  No data recorded  Are There Guns or Other Weapons in Your Home?  No data recorded   Types of Guns/Weapons: No data recorded   Are These Weapons Safely Secured?                              No data recorded   Who Could Verify You Are Able To Have These Secured:    No data recorded Do You Have any Outstanding Charges, Pending Court Dates, Parole/Probation? No data recorded Contacted To Inform of Risk of Harm To Self or Others: No data recorded [facility]  Does Patient Present under Involuntary Commitment? No   IVC Papers Initial File Date: No data recorded  [place] of Residence: [place]  Patient Currently Receiving the Following Services: -- (no services in place at this time.)   Determination of Need: Urgent (48 hours)   Options For Referral: Medication Management; Chemical Dependency Intensive Outpatient Therapy (CDIOP); Outpatient Therapy   Melynda Ripple, Counselor

## 2020-04-24 NOTE — ED Notes (Signed)
Pt's belongings, including clothes/shoes/black & blue duffel bag, placed in "patient belongings 9-12" cabinet.

## 2020-04-24 NOTE — ED Notes (Signed)
Olegario Messier, mom, [phone], would like someone to call her about son.

## 2020-04-24 NOTE — ED Provider Notes (Signed)
Patient cleared for discharge by psychiatry   Lorre Nick, MD 04/24/20 1222

## 2020-04-24 NOTE — ED Notes (Signed)
Discharge paperwork reviewed with pt. Pt verbalized understanding, all questions answered at time of discharge. 2 bags- 1 pt belonging bag; 1 black [employer] bag returned to pt. No further need expressed.

## 2020-05-01 ENCOUNTER — Ambulatory Visit (INDEPENDENT_AMBULATORY_CARE_PROVIDER_SITE_OTHER): Payer: No Payment, Other | Admitting: Behavioral Health

## 2020-05-01 ENCOUNTER — Other Ambulatory Visit: Payer: Self-pay

## 2020-05-01 DIAGNOSIS — F1020 Alcohol dependence, uncomplicated: Secondary | ICD-10-CM | POA: Diagnosis not present

## 2020-05-01 NOTE — Progress Notes (Signed)
Curtis Ross is a 30 y.o. male patient who presents today for a virtual appointment for an SAIOP intake session with this Clinical research associate. Client reports alcohol to be his drug(s) of choice. Client reports "last night" as his date of last use (Route of use: drinking). Client was assessed on 04/24/2020 and meets criteria for ASAM Level 2.1 treatment. Ashraf is being recommended for Substance Abuse Intensive Outpatient Program [facility]). Client has agreed to attend SAIOP group therapy on Mondays, Wednesdays, and Fridays from 9:00am - 12:00pm. Client participated in the following screening tools during today's session: CAGE (please see scores and indicators below). Group rules and group expectations were discussed with client during today's session. Client did not express any concerns regarding group rules and expectations.  Triggers:  UKN  Probation/TASC: Current - DUI - Upcoming court date: 06/20/2020  DWI Requirements: 40 Hours of SA Treatment    URICA Score: N/A  CAGE Score: 4   SAIOP Start Date:  05/04/2020    Virtual Visit via Telephone Note  I connected with Curtis Ross on 05/01/20 at  3:00 PM EST by telephone and verified that I am speaking with the correct person using two identifiers.  Location: Patient: Home Provider: Clinical Home - [facility]   I discussed the limitations, risks, security and privacy concerns of performing an evaluation and management service by telephone and the availability of in person appointments. I also discussed with the patient that there may be a patient responsible charge related to this service. The patient expressed understanding and agreed to proceed.  I discussed the assessment and treatment plan with the patient. The patient was provided an opportunity to ask questions and all were answered. The patient agreed with the plan and demonstrated an understanding of the instructions.   The patient was advised to call back or seek an in-person evaluation if  the symptoms worsen or if the condition fails to improve as anticipated.  I provided 15 minutes of non-face-to-face time during this encounter.   Curtis Ross, Counselor      Curtis Ross, Counselor

## 2020-05-04 ENCOUNTER — Other Ambulatory Visit: Payer: Self-pay

## 2020-05-04 ENCOUNTER — Ambulatory Visit (INDEPENDENT_AMBULATORY_CARE_PROVIDER_SITE_OTHER): Payer: No Payment, Other | Admitting: Behavioral Health

## 2020-05-04 DIAGNOSIS — F1020 Alcohol dependence, uncomplicated: Secondary | ICD-10-CM

## 2020-05-07 ENCOUNTER — Ambulatory Visit (HOSPITAL_COMMUNITY): Payer: Federal, State, Local not specified - Other | Admitting: Behavioral Health

## 2020-05-09 ENCOUNTER — Other Ambulatory Visit: Payer: Self-pay

## 2020-05-09 ENCOUNTER — Encounter (HOSPITAL_COMMUNITY): Payer: Self-pay | Admitting: Medical

## 2020-05-09 ENCOUNTER — Ambulatory Visit (INDEPENDENT_AMBULATORY_CARE_PROVIDER_SITE_OTHER): Payer: No Payment, Other | Admitting: Behavioral Health

## 2020-05-09 DIAGNOSIS — F1020 Alcohol dependence, uncomplicated: Secondary | ICD-10-CM

## 2020-05-09 DIAGNOSIS — F17210 Nicotine dependence, cigarettes, uncomplicated: Secondary | ICD-10-CM

## 2020-05-09 DIAGNOSIS — F10282 Alcohol dependence with alcohol-induced sleep disorder: Secondary | ICD-10-CM | POA: Diagnosis not present

## 2020-05-09 DIAGNOSIS — Z811 Family history of alcohol abuse and dependence: Secondary | ICD-10-CM

## 2020-05-09 DIAGNOSIS — F1094 Alcohol use, unspecified with alcohol-induced mood disorder: Secondary | ICD-10-CM

## 2020-05-09 DIAGNOSIS — F1210 Cannabis abuse, uncomplicated: Secondary | ICD-10-CM

## 2020-05-09 DIAGNOSIS — F1410 Cocaine abuse, uncomplicated: Secondary | ICD-10-CM

## 2020-05-09 MED ORDER — BACLOFEN 10 MG PO TABS: 10.0000 mg | ORAL_TABLET | Freq: Three times a day (TID) | ORAL | 2 refills | Status: AC

## 2020-05-09 MED ORDER — TRAZODONE HCL 50 MG PO TABS: 50.0000 mg | ORAL_TABLET | Freq: Every evening | ORAL | 1 refills | Status: DC | PRN

## 2020-05-09 NOTE — Progress Notes (Signed)
Psychiatric Initial Adult Assessment   Patient Identification: Curtis Ross MRN:  [id number] Date of Evaluation:  05/09/2020 Referral Source[facility] Chief Complaint:   Chief Complaint    Alcohol Problem; Establish Care     Visit Diagnosis:    ICD-10-CM   1. Alcohol use disorder, severe, dependence (HCC)  F10.20   2. Alcohol-induced mood disorder (HCC)  F10.94   3. 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 Family history of alcoholism in paternal grandmother  Z51.1   5. Mild tetrahydrocannabinol (THC) abuse  F12.10   6. Cocaine abuse (HCC)  F14.10   7. Tobacco smoker, 1 pack of cigarettes or less per day  F17.210     History of Present Illness:  Pt presented to [facility] 04/24/2020 States that he was at a friends house last night with plans to stay overnight. He did go to this friends house. However, ended up drinking and discussing with that friend how depressed and anxious he has felt for the past 7 months. Also, reported that he has been  binge drinking to manage his symptoms of depression and anxiety. States that after drinking a large amount of alcohol and receiving support from friends he decided to go to [facility] for help. His friend transported him to [facility] via private vehicle.   BH Assessment 04/24/2020 Determination of Need: Urgent (48 hours) Options For Referral: Medication Management; Chemical Dependency Intensive Outpatient Therapy (CDIOP); Outpatient Therapy  [facility] Assessment Progress Note Per Shuvon Rankin, NP, this voluntary pt does not require psychiatric hospitalization at this time.  Pt is psychiatrically cleared.  Discharge instructions advise pt to follow up with [facility].  EDP Lorre Nick, MD and pt's nurse, Carolynne, have been notified.  On 05/01/2020 pt was seen for SAIOP consideration Curtis Ross is a 30 y.o. male patient who presents today for a virtual appointment for an SAIOP intake  session with this Clinical research associate. Client reports alcohol to be his drug(s) of choice. Client reports "last night" as his date of last use (Route of use: drinking). Client was assessed on 04/24/2020 and meets criteria for ASAM Level 2.1 treatment. Maksym is being recommended for Substance Abuse Intensive Outpatient Program [facility]). Client has agreed to attend SAIOP group therapy on Mondays, Wednesdays, and Fridays from 9:00am - 12:00pm. Client participated in the following screening tools during today's session: CAGE (please see scores and indicators below). Group rules and group expectations were discussed with client during today's session. Client did not express any concerns regarding group rules and expectations SAIOP Start Date:  05/04/2020  In speaking with him today he remains motivated for treatment. 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 Except substance induced mood anxiety and sleep disturbances. He will begin Trazodone for those issues.  Associated Signs/Symptoms: DSM V SUD Criteria 11/11+ Alcohol severe dependence ASAM 6 Dimensions Level 2 IOP AUDIT- Probable alcoholism  Depression Symptoms:   depressed mood, anhedonia, feelings of worthlessness/guilt, anxiety, PHQ 9 Score 3   (Hypo) Manic Symptoms: Substance use related  Impulsivity, Labiality of Mood,  Anxiety Symptoms:  Excessive Worry,   Psychotic Symptoms:  none   PTSD Symptoms: Negative  Past Psychiatric History:  Denies a history of outpatient therapyHe states he has never done formal detox but has been going to AA meeting sporadically. He was admitted to [facility] in 2017 for depression and suicidal ideations  Previous Psychotropic Medications: Yes   Substance Abuse History in the last 12 months:   Alcohol/Drug Use: Alcohol / Drug Use History of alcohol / drug use?: Yes Longest period of sobriety (when/how  long): "a couple of months" -last year ([id number]) Negative Consequences of Use: Financial,Legal,Personal relationships,Work / School Withdrawal Symptoms: Patient aware of relationship between substance abuse and physical/medical complications (Patient denies withdrawal symptoms at this time.) Substance #1 Name of Substance 1: Alcohol 1 - Age of First Use: 30 yrs old 1 - Amount (size/oz): binge drink; aproximately 22 drinks (malt liqour or beer) over the course of 2-3 days 1 - Frequency: variees; 2-3 days per week 1 - Duration: 3 yrs 1 - Last Use / Amount: 04/23/2020; patient states that he had #12 malt liquors 1 - Method of Aquiring: purchases from any store that sells alcohol 1- Route of Use: oral     Consequences of Substance Abuse: Denies current withdrawal symptoms. No history of seizures and/or DT's. Denies drug use.  States that his only legal issue is the DUI that he obtained, January 2020. His court date is 06/20/2020. He is currently on probation at this time  Anxiety;Dysthymia;Insomnia  Past Medical History:  Past Medical History:  Diagnosis Date  . Medical history non-contributory     Past Surgical History:  Procedure Laterality Date  . TYMPANOSTOMY TUBE PLACEMENT      Family Psychiatric History:  PGM alcoholic  Family History:  Family History  Problem Relation Age of Onset  . Alcoholism Paternal Grandmother     Social History:   Social History   Socioeconomic History  . Marital status: Single  Marital status: Separated . "We are going to just file for divorce and be done with it"    Spouse name: NA  . Number of children: None  . Years of education: Last Grade Completed:  (some college) Name of High School: Graduated from [employer]  . Highest education level: above  Occupational History  . 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 Smoking status: Current  Every Day Smoker    Packs/day: 1.00  . Smokeless tobacco: Never Used  Vaping Use  . Vaping Use: Never used  Substance and Sexual Activity  . Alcohol use: Yes    Comment: Last drink: 2:45 am.   . Drug use: Yes    Types: Marijuana, Cocaine    Comment: Last used Marijuana: yesterday. Last used: Cocaine: 2 months ago   . Sexual activity: hetero sexual  Other Topics Concern  . No  Social History Narrative  . See [facility]   Social Determinants of Health   Financial Resource Strain: Not on file  Food Insecurity: No  Transportation Needs: No  Physical Activity: Do You Exercise?: No  Stress: Stressors:  Relationship ,Legal issues;Also, work stress. States that he works in a [employer] full time and it's never staffed appropriately. The environment is always cahotic.  Leisure / Recreation Do You Have Hobbies?: No  Social Connections:Pending divorce;Patient states that he has a good relationship with parents for the most part. States that when he drinks they have experienced problems but from day to day they get alone prettty well.(1/2 brother and 1/2 sister: "We are all pretty close")    Additional Social History:  Allergies:  No Known Allergies  Metabolic Disorder Labs: No results found for: HGBA1C, MPG No results found for: PROLACTIN Lab Results  Component Value Date  CHOL 174 05/24/2019   TRIG 141 05/24/2019   HDL 48 05/24/2019   CHOLHDL 3.6 05/24/2019   LDLCALC 101 (H) 05/24/2019   Lab Results  Component Value Date   TSH 0.721 05/28/2015    Therapeutic Level Labs:NA No results found for: LITHIUM No results found for: CBMZ No results found for: VALPROATE  Current Medications: Current Outpatient Medications  Medication Sig Dispense Refill  . baclofen (LIORESAL) 10 MG tablet Take 1 tablet (10 mg total) by mouth 3 (three) times daily. 90 tablet 2  . traZODone (DESYREL) 50 MG tablet Take 1 tablet (50 mg total) by mouth at bedtime as needed and may repeat dose one time  if needed for sleep. 60 tablet 1  . ibuprofen (ADVIL,MOTRIN) 200 MG tablet Take 800 mg by mouth every 6 (six) hours as needed for moderate pain.     No current facility-administered medications for this visit.    Musculoskeletal: Strength & Muscle Tone: within normal limits Gait & Station: normal Patient leans: N/A  Psychiatric Specialty Exam: Review of Systems  Constitutional: Positive for activity change and appetite change. Negative for chills, diaphoresis, fatigue, fever and unexpected weight change.       Stopping using  HENT: Negative for congestion, postnasal drip, rhinorrhea, sinus pressure, sinus pain, sneezing and sore throat.   Eyes: Negative.   Respiratory: Negative for apnea, cough, choking, chest tightness, shortness of breath, wheezing and stridor.        Smoker  Cardiovascular: Positive for palpitations. Negative for chest pain and leg swelling.  Gastrointestinal: Negative for abdominal distention, abdominal pain, anal bleeding, blood in stool, constipation, diarrhea, nausea, rectal pain and vomiting.  Endocrine: Negative for cold intolerance, heat intolerance, polydipsia, polyphagia and polyuria.  Genitourinary: Negative.   Musculoskeletal: Negative for arthralgias, back pain, gait problem, joint swelling, myalgias, neck pain and neck stiffness.  Skin: Negative for color change, pallor, rash and wound.  Neurological: Negative for dizziness, tremors, seizures, syncope, facial asymmetry, speech difficulty, weakness, light-headedness, numbness and headaches.  Hematological: Negative for adenopathy. Does not bruise/bleed easily.  Psychiatric/Behavioral: Positive for agitation (lcohol cravings), dysphoric mood and sleep disturbance. Negative for behavioral problems, confusion, decreased concentration, hallucinations, self-injury and suicidal ideas. The patient is nervous/anxious. The patient is not hyperactive.     There were no vitals taken for this visit.There is no height  or weight on file to calculate BMI.  General Appearance: Casual and Neat  Eye Contact:  Good  Speech:  Clear and Coherent and Normal Rate  Volume:  Normal  Mood:  Anxious  Affect:  Appropriate and Congruent  Thought Process:  Coherent, Goal Directed and Descriptions of Associations: Intact  Orientation:  Full (Time, Place, and Person)  Thought Content:  WDL, Obsessions and Rumination  Suicidal Thoughts:  No  Homicidal Thoughts:  No  Memory:  Negative  Judgement:  Fair  Insight:  Lacking  Psychomotor Activity:  Negative  Concentration:  Concentration: Good and Attention Span: Good  Recall:  Negative  Fund of Knowledge:WDL  Language: Good  Akathisia:  NA  Handed:  Right  AIMS (if indicated): NA  Assets:  Desire for Improvement Housing Resilience Social Support Talents/Skills Transportation Vocational/Educational  ADL's:  Intact  Cognition: WNL  Sleep:  Poor   Screenings: AUDIT   Flowsheet Row [facility]  Alcohol Use Disorder Identification Test Final Score (AUDIT) 10    PHQ2-9   Flowsheet Row Office Visit from 05/24/2019 in [facility]  Office Visit from 09/28/2014 in [facility]  PHQ-2 Total Score 2 5  PHQ-9 Total Score 3 20    Flowsheet Row ED from 04/23/2020 in [facility] from [facility]01/2017 [facility] from 08/31/2017 in [facility]  C-SSRS RISK CATEGORY Low Risk Low Risk Low Risk      Assessment Alcohol Dependence with associated menta emotional relationship and legal problems Adult grandchild of alcoholic (PGM)   and Plan: Treatment Plan/Recommendations:  Plan of Care: SUD/Core issues SAIOP see Counselor's individualized treatment plan  Laboratory:  UDS per protocol  Psychotherapy: IOP Group/Individual/family  Medications: See list- MAT Baclofen Trazodone   Routine PRN Medications:  No  Consultations: NA  Safety Concerns: RISK ASSESSMENT -Negative  Other:  NA   Maryjean Morn, PA-C 3/16/202211:07 AM

## 2020-05-11 ENCOUNTER — Other Ambulatory Visit: Payer: Self-pay

## 2020-05-11 ENCOUNTER — Ambulatory Visit (INDEPENDENT_AMBULATORY_CARE_PROVIDER_SITE_OTHER): Payer: No Payment, Other | Admitting: Behavioral Health

## 2020-05-11 DIAGNOSIS — F1020 Alcohol dependence, uncomplicated: Secondary | ICD-10-CM | POA: Diagnosis not present

## 2020-05-11 NOTE — Group Note (Signed)
Group Topic: Guilt/Shame  Group Date: 05/11/2020 Start Time:  9:00 AM End Time: 12:00 PM Facilitators: Mamie Nick, Counselor  Department: [facility]  Number of Participants: 4  Group Focus: abuse issues, acceptance, affirmation, anxiety, check in, coping skills, family, feeling awareness/expression, forgiveness, relapse prevention, relaxation, self-awareness, and self-esteem Treatment Modality:  Cognitive Behavioral Therapy Interventions utilized were clarification, confrontation, exploration, other guided meditation, and support Purpose: enhance coping skills, explore maladaptive thinking, express feelings, express irrational fears, improve communication skills, increase insight, regain self-worth, reinforce self-care, relapse prevention strategies, and trigger / craving management   Name: Curtis Ross Date of Birth: January 01, 1991  MR: [id number]    Level of Participation: active Quality of Participation: attentive and cooperative Interactions with others: gave feedback Mood/Affect: appropriate Triggers (if applicable): N/A Cognition: coherent/clear and insightful Progress: Minimal Response: Clt checked in by sharing his peaks "I got some good news about court this week after talking to my lawyer". Clt shared this valleys to be "I've been having a few cravings here and there". Clt reports his new medication (Baclofen) has been doing well with helping to manage his cravings and urges. Clt shared his word of the day is "acquittal". Clt talked about his his past experience with alcohol detox treatment. Clt engaged well in group discussion by providing insightful and supportive verbal feedback.    Plan: follow-up needed  Patients Problems:  Patient Active Problem List   Diagnosis Date Noted  . GAD (generalized anxiety disorder) 05/28/2015  . Alcohol use disorder, severe, dependence (HCC) 05/26/2015     Family Program: Family present? No   Name of  family member(s): N/A  UDS collected: No Results: N/A  AA/NA attended?: No  Sponsor?: No

## 2020-05-12 NOTE — Group Note (Signed)
Group Topic: Feelings and Emotions  Group Date: 05/04/2020 Start Time:  9:00 AM End Time: 12:00 PM Facilitators: Mamie Nick, Counselor  Department: [facility]  Number of Participants: 3  Group Focus: acceptance, anger management, anxiety, check in, coping skills, family, and relapse prevention Treatment Modality:  Cognitive Behavioral Therapy Interventions utilized were clarification, confrontation, and exploration Purpose: enhance coping skills, explore maladaptive thinking, express feelings, express irrational fears, improve communication skills, increase insight, regain self-worth, reinforce self-care, relapse prevention strategies, and trigger / craving management   Name: AKIN YI Date of Birth: [birth year]  MR: [id number]    Level of Participation: active Quality of Participation: attentive, cooperative, motivated and offered feedback Interactions with others: gave feedback Mood/Affect: appropriate Triggers (if applicable): N/A Cognition: coherent/clear and insightful Progress: Minimal Response: Today is client's first day attending SAIOP group. Clt checked in by sharing his peaks and valleys - Peaks: "I went fishing with my dad and it felt good to be able to be sober and spend time with my dad doing something we enjoy doing together" ... Valleys: "I had to go to court the other day so I'm waiting for the outcome". Clt shared that he remembers the negative behaviors his alcohol use caused in the past. He shared having feelings of guilt and shame because of these past behaviors. Plan: follow-up needed  Patients Problems:  Patient Active Problem List   Diagnosis Date Noted  . GAD (generalized anxiety disorder) 05/28/2015  . Alcohol use disorder, severe, dependence (HCC) 05/26/2015     Family Program: Family present? No   Name of family member(s): N/A  UDS collected: No Results: N/A  AA/NA attended?: No  Sponsor?: No

## 2020-05-14 ENCOUNTER — Ambulatory Visit (HOSPITAL_COMMUNITY): Payer: Federal, State, Local not specified - Other | Admitting: Behavioral Health

## 2020-05-16 ENCOUNTER — Other Ambulatory Visit: Payer: Self-pay

## 2020-05-16 ENCOUNTER — Ambulatory Visit (HOSPITAL_COMMUNITY): Payer: Federal, State, Local not specified - Other | Admitting: Behavioral Health

## 2020-05-16 NOTE — Group Note (Signed)
Group Topic: General Coping Skills  Group Date: 05/09/2020 Start Time:  9:00 AM End Time: 12:00 PM Facilitators: Mamie Nick, Counselor  Department: [facility]  Number of Participants: 3  Group Focus: affirmation, check in, communication, coping skills, depression, family, feeling awareness/expression, forgiveness, relapse prevention, and relaxation Treatment Modality:  Cognitive Behavioral Therapy Interventions utilized were clarification, confrontation, and exploration Purpose: enhance coping skills, explore maladaptive thinking, express feelings, express irrational fears, improve communication skills, increase insight, regain self-worth, reinforce self-care, relapse prevention strategies, and trigger / craving management   Name: Curtis Ross Date of Birth: [birth year]  MR: [id number]    Level of Participation: active Quality of Participation: attentive, cooperative and offered feedback Interactions with others: gave feedback Mood/Affect: appropriate Triggers (if applicable): N/A Cognition: coherent/clear Progress: Gaining insight Response: Clt checked in by sharing that he was able to spend time with his sister and his nieces. He reflected on this moment with his family and thought "I wouldn't be able to enjoy these moments if I'm drunk and intoxicated". Clt is gaining increased insight into his addiction and considering the cost of his past alcohol use. He reports not having any valleys to report today. He shared that his word of the day is "rebirth". Clt reports positive response to his new prescribed medication Baclofen as he reports noticing a decrease in his urges and cravings to drink. Clt discussed trigger recognition.  Plan: follow-up needed  Patients Problems:  Patient Active Problem List   Diagnosis Date Noted  . GAD (generalized anxiety disorder) 05/28/2015  . Alcohol use disorder, severe, dependence (HCC) 05/26/2015     Family  Program: Family present? No   Name of family member(s): N/A  UDS collected: No Results: N/A  AA/NA attended?: No  Sponsor?: No

## 2020-05-18 ENCOUNTER — Other Ambulatory Visit: Payer: Self-pay

## 2020-05-18 ENCOUNTER — Ambulatory Visit (HOSPITAL_COMMUNITY): Payer: Federal, State, Local not specified - Other | Admitting: Behavioral Health

## 2020-05-21 ENCOUNTER — Other Ambulatory Visit: Payer: Self-pay

## 2020-05-21 ENCOUNTER — Ambulatory Visit (INDEPENDENT_AMBULATORY_CARE_PROVIDER_SITE_OTHER): Payer: No Payment, Other | Admitting: Behavioral Health

## 2020-05-21 ENCOUNTER — Encounter (HOSPITAL_COMMUNITY): Payer: Self-pay | Admitting: Behavioral Health

## 2020-05-21 DIAGNOSIS — F1210 Cannabis abuse, uncomplicated: Secondary | ICD-10-CM

## 2020-05-21 DIAGNOSIS — F1094 Alcohol use, unspecified with alcohol-induced mood disorder: Secondary | ICD-10-CM

## 2020-05-21 DIAGNOSIS — F10282 Alcohol dependence with alcohol-induced sleep disorder: Secondary | ICD-10-CM

## 2020-05-21 DIAGNOSIS — F1020 Alcohol dependence, uncomplicated: Secondary | ICD-10-CM

## 2020-05-21 DIAGNOSIS — F1410 Cocaine abuse, uncomplicated: Secondary | ICD-10-CM

## 2020-05-21 DIAGNOSIS — Z811 Family history of alcohol abuse and dependence: Secondary | ICD-10-CM

## 2020-05-21 NOTE — Progress Notes (Signed)
   [facility] Follow-up Outpatient CDIOP Date: 05/21/2020  Admission Date: 05/09/2020  Sobriety date: 04/23/2020  Subjective: 'I'M DOING GOOD"  HPI :IOP Provider FU  Review of Systems: Psychiatric: Agitation: No Hallucination: No Depressed Mood: Yes much better Insomnia: No Hypersomnia: No Altered Concentration: No Feels Worthless: No Grandiose Ideas: No Belief In Special Powers: No New/Increased Substance Abuse: No Compulsions: No  Neurologic: Headache: No Seizure: No Paresthesias: No  Current Medications:   Vital Signs  Mental Status Examination  Appearance: Alert: Yes Attention: good  Cooperative: Yes Eye Contact: Good Speech: Clear and coherent Psychomotor Activity: Normal Memory/Concentration: Normal/intact Oriented: person, place, time/date and situation Mood: Euthymic Affect: Appropriate and Congruent Thought Processes and Associations: Coherent and Intact Fund of Knowledge: Good Thought Content: WDL Insight: Good Judgement: Good  UDS:  PDMP:  Diagnosis:   Assessment:  Treatment Plan: Maryjean Morn, PA-C

## 2020-05-21 NOTE — Group Note (Signed)
Group Topic: Relapse Prevention  Group Date: 05/21/2020 Start Time:  9:00 AM End Time: 12:00 PM Facilitators: Mamie Nick, Counselor  Department: [facility]  Number of Participants: 6  Group Focus: affirmation, anger management, check in, communication, coping skills, dual diagnosis, family, feeling awareness/expression, impulsivity, loss/grief issues, relapse prevention, relaxation, and self-esteem Treatment Modality:  Cognitive Behavioral Therapy Interventions utilized were clarification, confrontation, exploration, and support Purpose: enhance coping skills, explore maladaptive thinking, express feelings, express irrational fears, improve communication skills, increase insight, regain self-worth, reinforce self-care, relapse prevention strategies, and trigger / craving management   Name: FERGUS THRONE Date of Birth: 21-May-1990  MR: [id number]    Level of Participation: active Quality of Participation: attentive, cooperative, motivated and offered feedback Interactions with others: gave feedback Mood/Affect: appropriate Triggers (if applicable): N/A Cognition: coherent/clear, insightful and logical Progress: Moderate Response: Client checked in by sharing that he spent the weekend grilling on the smoker with his dad. He reports he enjoyed spending this time with his father; however, clt reports he was somewhat triggered during this time. He reports being triggered because he would normally drink while attending a cookout or grilling outside with his dad. He shared that he remembered when he would drink a few beers. He shared that he was able to manage these cravings by shifting his thoughts and eliminating the thought to drink. He also shared that the easiest thing for him to change since being in recovery is "Feeling better and not feeling like shit". Plan: follow-up needed  Patients Problems:  Patient Active Problem List   Diagnosis Date Noted   . GAD (generalized anxiety disorder) 05/28/2015  . Alcohol use disorder, severe, dependence (HCC) 05/26/2015     Family Program: Family present? No   Name of family member(s): N/A  UDS collected: No Results: N/A  AA/NA attended?: No  Sponsor?: No

## 2020-05-23 ENCOUNTER — Ambulatory Visit (HOSPITAL_COMMUNITY): Payer: Federal, State, Local not specified - Other | Admitting: Behavioral Health

## 2020-05-23 ENCOUNTER — Other Ambulatory Visit: Payer: Self-pay

## 2020-05-23 ENCOUNTER — Encounter (HOSPITAL_COMMUNITY): Payer: Self-pay

## 2020-05-25 ENCOUNTER — Ambulatory Visit (HOSPITAL_COMMUNITY): Payer: Federal, State, Local not specified - Other

## 2020-05-25 ENCOUNTER — Telehealth (HOSPITAL_COMMUNITY): Payer: Federal, State, Local not specified - Other | Admitting: Psychiatry

## 2020-05-28 ENCOUNTER — Ambulatory Visit (HOSPITAL_COMMUNITY): Payer: Federal, State, Local not specified - Other | Admitting: Behavioral Health

## 2020-05-30 ENCOUNTER — Other Ambulatory Visit: Payer: Self-pay

## 2020-05-30 ENCOUNTER — Ambulatory Visit (INDEPENDENT_AMBULATORY_CARE_PROVIDER_SITE_OTHER): Payer: No Payment, Other | Admitting: Behavioral Health

## 2020-05-30 DIAGNOSIS — F1020 Alcohol dependence, uncomplicated: Secondary | ICD-10-CM | POA: Diagnosis not present

## 2020-05-30 NOTE — Group Note (Signed)
Group Topic: Feelings and Emotions  Group Date: 05/30/2020 Start Time:  9:00 AM End Time: 12:00 PM Facilitators: Mamie Nick, Counselor  Department: [facility]  Number of Participants: 4  Group Focus: acceptance, affirmation, anger management, anxiety, check in, coping skills, depression, family, feeling awareness/expression, healthy friendships, relapse prevention, and relaxation Treatment Modality:  Cognitive Behavioral Therapy Interventions utilized were clarification, confrontation, exploration, and group exercise Purpose: enhance coping skills, explore maladaptive thinking, express feelings, express irrational fears, improve communication skills, increase insight, regain self-worth, reinforce self-care, relapse prevention strategies, and trigger / craving management  Name: Curtis Ross Date of Birth: 05-21-1990  MR: [id number]    Level of Participation: active Quality of Participation: attentive and offered feedback Interactions with others: gave feedback Mood/Affect: appropriate Triggers (if applicable): N/A Cognition: coherent/clear Progress: Moderate Response: Clt checked in by sharing that he has been struggling with his sobriety. Clt offered supportive feedback to his peers. Clt shared that he becomes stressed when he has to be the only one caring for his mother. He shared that this triggers him to drink.  Plan: follow-up needed  Patients Problems:  Patient Active Problem List   Diagnosis Date Noted   GAD (generalized anxiety disorder) 05/28/2015   Alcohol use disorder, severe, dependence (HCC) 05/26/2015     Family Program: Family present? No   Name of family member(s): N/A  UDS collected: No Results: N/A  AA/NA attended?: No  Sponsor?: No

## 2020-06-01 ENCOUNTER — Other Ambulatory Visit: Payer: Self-pay

## 2020-06-01 ENCOUNTER — Ambulatory Visit (INDEPENDENT_AMBULATORY_CARE_PROVIDER_SITE_OTHER): Payer: No Payment, Other | Admitting: Behavioral Health

## 2020-06-01 DIAGNOSIS — F1020 Alcohol dependence, uncomplicated: Secondary | ICD-10-CM

## 2020-06-04 ENCOUNTER — Ambulatory Visit (INDEPENDENT_AMBULATORY_CARE_PROVIDER_SITE_OTHER): Payer: No Payment, Other | Admitting: Behavioral Health

## 2020-06-04 ENCOUNTER — Other Ambulatory Visit: Payer: Self-pay

## 2020-06-04 DIAGNOSIS — F1020 Alcohol dependence, uncomplicated: Secondary | ICD-10-CM

## 2020-06-04 NOTE — Group Note (Addendum)
Group Topic: Relapse Prevention  Group Date: 06/04/2020 Start Time:  9:00 AM End Time: 12:00 PM Facilitators: Mamie Nick, Counselor  Department: [facility]  Number of Participants: 3  Group Focus: anger management, anxiety, clarity of thought, coping skills, depression, family, feeling awareness/expression, forgiveness, and relapse prevention Treatment Modality:  Cognitive Behavioral Therapy Interventions utilized were clarification, confrontation, and exploration Purpose: enhance coping skills, explore maladaptive thinking, express feelings, express irrational fears, improve communication skills, increase insight, regain self-worth, reinforce self-care, relapse prevention strategies, and trigger / craving management   Name: Curtis Ross Date of Birth: 15-Dec-1990  MR: [id number]    Level of Participation: active Quality of Participation: attentive and cooperative Interactions with others: gave feedback Mood/Affect: appropriate Triggers (if applicable): N/A Cognition: coherent/clear Progress: Moderate Response: ben checked in by sharing that he spent his weekend with his family celebrating his niece's 8th birthday. He also shared that his family is his "place of peace". [name] also shared that he watched the [employer] game with his mother this weekend. He reports he enjoys spending time with his family sober because he remembers all the moments when they've have seen him drunk and belligerent. He reports he wants to make up for missed times in the past. Client rated his depression a 2 and his anxiety a 3, on a scale 0 to 10 with 10 being the worse. Client engaged well in group discussion.  Plan: follow-up needed  Patients Problems:  Patient Active Problem List   Diagnosis Date Noted  . GAD (generalized anxiety disorder) 05/28/2015  . Alcohol use disorder, severe, dependence (HCC) 05/26/2015     Family Program: Family present? No   Name of family  member(s): N/A  UDS collected: Yes Results: Negative for all substances  AA/NA attended?: No  Sponsor?: No

## 2020-06-04 NOTE — Group Note (Signed)
Group Topic: Boundaries  Group Date: 06/01/2020 Start Time:  9:00 AM End Time: 12:00 PM Facilitators: Mamie Nick, Counselor  Department: [facility]  Number of Participants: 6  Group Focus: acceptance, anxiety, check in, coping skills, depression, diagnosis education, family, feeling awareness/expression, forgiveness, loss/grief issues, personal responsibility, problem solving, and relapse prevention Treatment Modality:  Cognitive Behavioral Therapy Interventions utilized were clarification, confrontation, exploration, group exercise, and support Purpose: enhance coping skills, explore maladaptive thinking, express feelings, express irrational fears, improve communication skills, increase insight, regain self-worth, reinforce self-care, relapse prevention strategies, and trigger / craving management   Name: Curtis Ross Date of Birth: 14-Apr-1990  MR: [id number]    Level of Participation: active Quality of Participation: attentive, cooperative and motivated Interactions with others: gave feedback Mood/Affect: appropriate Triggers (if applicable): N/A Cognition: coherent/clear Progress: Gaining insight Response: Client checked in by sharing that his valley is he has to report to county jail and serve 14 days for an offense he obtained during his days of active addiction. He also self-reported to the group that he attended a concert with his father and brother and while at the concert he snuck a beer from the bar and drank it. He reports he immediately felt guilty for drinking this beer and the fact that he felt he had to hide it reminded him that it was something he shouldn't do. He also reports he thought about his group and "not wanting to let them down". Client was receptive to support and feedback offered to him from his peers.  Plan: follow-up needed  Patients Problems:  Patient Active Problem List   Diagnosis Date Noted  . GAD (generalized  anxiety disorder) 05/28/2015  . Alcohol use disorder, severe, dependence (HCC) 05/26/2015     Family Program: Family present? No   Name of family member(s): N/A  UDS collected: No Results: N/A  AA/NA attended?: No  Sponsor?: No

## 2020-06-06 ENCOUNTER — Ambulatory Visit (HOSPITAL_COMMUNITY): Payer: Federal, State, Local not specified - Other | Admitting: Behavioral Health

## 2020-06-06 ENCOUNTER — Other Ambulatory Visit: Payer: Self-pay

## 2020-06-08 ENCOUNTER — Ambulatory Visit (HOSPITAL_COMMUNITY): Payer: Federal, State, Local not specified - Other | Admitting: Behavioral Health

## 2020-06-08 ENCOUNTER — Other Ambulatory Visit: Payer: Self-pay

## 2020-06-11 ENCOUNTER — Ambulatory Visit (HOSPITAL_COMMUNITY): Payer: Federal, State, Local not specified - Other

## 2020-06-13 ENCOUNTER — Ambulatory Visit (HOSPITAL_COMMUNITY): Payer: Federal, State, Local not specified - Other | Admitting: Behavioral Health

## 2020-06-15 ENCOUNTER — Ambulatory Visit (HOSPITAL_COMMUNITY): Payer: Federal, State, Local not specified - Other

## 2020-06-18 ENCOUNTER — Ambulatory Visit (HOSPITAL_COMMUNITY): Payer: Federal, State, Local not specified - Other

## 2020-06-20 ENCOUNTER — Ambulatory Visit (HOSPITAL_COMMUNITY): Payer: Federal, State, Local not specified - Other

## 2020-06-22 ENCOUNTER — Ambulatory Visit (HOSPITAL_COMMUNITY): Payer: Federal, State, Local not specified - Other

## 2020-06-25 ENCOUNTER — Other Ambulatory Visit: Payer: Self-pay

## 2020-06-25 ENCOUNTER — Ambulatory Visit (HOSPITAL_COMMUNITY): Payer: Federal, State, Local not specified - Other | Admitting: Behavioral Health

## 2020-06-27 ENCOUNTER — Ambulatory Visit (INDEPENDENT_AMBULATORY_CARE_PROVIDER_SITE_OTHER): Payer: No Payment, Other | Admitting: Behavioral Health

## 2020-06-27 ENCOUNTER — Other Ambulatory Visit: Payer: Self-pay

## 2020-06-27 DIAGNOSIS — F1020 Alcohol dependence, uncomplicated: Secondary | ICD-10-CM

## 2020-06-27 NOTE — Group Note (Signed)
Group Topic: Identifying Triggers  Group Date: 06/27/2020 Start Time:  9:00 AM End Time: 12:00 PM Facilitators: Mamie Nick, Counselor  Department: [facility]  Number of Participants: 3  Group Focus: affirmation, anxiety, communication, coping skills, depression, family, feeling awareness/expression, impulsivity, relapse prevention, and safety plan Treatment Modality:  Cognitive Behavioral Therapy Interventions utilized were clarification, confrontation, exploration, and patient education Purpose: enhance coping skills, explore maladaptive thinking, express feelings, express irrational fears, improve communication skills, increase insight, regain self-worth, reinforce self-care, relapse prevention strategies, and trigger / craving management   Name: Curtis Ross Date of Birth: May 07, 1990  MR: [id number]    Level of Participation: active Quality of Participation: attentive and cooperative Interactions with others: gave feedback Mood/Affect: appropriate Triggers (if applicable): N/A Cognition: coherent/clear Progress: Moderate Response: Clt checked in by sharing that he is doing well in his recovery. He talked about his addiction as it relates to numbing his feelings and emotions. He shared that his addiction to alcohol cost him relationships, jobs, and things that really mattered to him. He shared that he has been able to manage his urges and cravings. Clt talked about his experience with attending AA groups. Plan: follow-up needed  Patients Problems:  Patient Active Problem List   Diagnosis Date Noted  . GAD (generalized anxiety disorder) 05/28/2015  . Alcohol use disorder, severe, dependence (HCC) 05/26/2015     Family Program: Family present? No   Name of family member(s): N/A  UDS collected: No Results: N/A  AA/NA attended?: No  Sponsor?: No

## 2020-06-29 ENCOUNTER — Ambulatory Visit (HOSPITAL_COMMUNITY): Payer: Federal, State, Local not specified - Other | Admitting: Behavioral Health

## 2020-07-02 ENCOUNTER — Other Ambulatory Visit: Payer: Self-pay

## 2020-07-02 ENCOUNTER — Ambulatory Visit (HOSPITAL_COMMUNITY): Payer: Federal, State, Local not specified - Other | Admitting: Behavioral Health

## 2020-07-02 NOTE — Group Note (Unsigned)
Group Topic: General Coping Skills  Group Date: 07/02/2020 Start Time:  9:00 AM End Time: 12:00 PM Facilitators: Mamie Nick, Counselor  Department: [facility]  Number of Participants: 4  Group Focus: affirmation, anxiety, communication, coping skills, depression, family, feeling awareness/expression, forgiveness, goals/reality orientation, and relapse prevention Treatment Modality:  Cognitive Behavioral Therapy Interventions utilized were clarification, confrontation, and exploration Purpose: enhance coping skills, explore maladaptive thinking, express feelings, express irrational fears, improve communication skills, increase insight, regain self-worth, reinforce self-care, relapse prevention strategies, and trigger / craving management   Name: Curtis Ross Date of Birth: [birth year]  MR: [id number]    Level of Participation: {THERAPIES; PSYCH GROUP PARTICIPATION BWGYK:59935} Quality of Participation: {THERAPIES; PSYCH QUALITY OF PARTICIPATION:23992} Interactions with others: {THERAPIES; PSYCH INTERACTIONS:23993} Mood/Affect: {THERAPIES; PSYCH MOOD/AFFECT:23994} Triggers (if applicable): *** Cognition: {THERAPIES; PSYCH COGNITION:23995} Progress: {THERAPIES; PSYCH PROGRESS:23997} Response: *** Plan: {THERAPIES; PSYCH TSVX:79390}  Patients Problems:  Patient Active Problem List   Diagnosis Date Noted   GAD (generalized anxiety disorder) 05/28/2015   Alcohol use disorder, severe, dependence (HCC) 05/26/2015     Family Program: Family present? {BHH YES OR NO:22294}   Name of family member(s): ***  UDS collected: {BHH YES OR NO:22294} Results: {Findings; urine drug screen:60936}  AA/NA attended?: {BHH YES OR NO:22294}{DAYS OF ZESP:23300}  Sponsor?: {BHH YES OR TM:22633}

## 2020-07-04 ENCOUNTER — Ambulatory Visit (INDEPENDENT_AMBULATORY_CARE_PROVIDER_SITE_OTHER): Payer: No Payment, Other | Admitting: Behavioral Health

## 2020-07-04 ENCOUNTER — Encounter (HOSPITAL_COMMUNITY): Payer: Self-pay | Admitting: Medical

## 2020-07-04 ENCOUNTER — Other Ambulatory Visit: Payer: Self-pay

## 2020-07-04 DIAGNOSIS — F17210 Nicotine dependence, cigarettes, uncomplicated: Secondary | ICD-10-CM

## 2020-07-04 DIAGNOSIS — F1410 Cocaine abuse, uncomplicated: Secondary | ICD-10-CM

## 2020-07-04 DIAGNOSIS — F10282 Alcohol dependence with alcohol-induced sleep disorder: Secondary | ICD-10-CM

## 2020-07-04 DIAGNOSIS — Z811 Family history of alcohol abuse and dependence: Secondary | ICD-10-CM

## 2020-07-04 DIAGNOSIS — F1094 Alcohol use, unspecified with alcohol-induced mood disorder: Secondary | ICD-10-CM

## 2020-07-04 DIAGNOSIS — F1020 Alcohol dependence, uncomplicated: Secondary | ICD-10-CM | POA: Diagnosis not present

## 2020-07-04 DIAGNOSIS — F1210 Cannabis abuse, uncomplicated: Secondary | ICD-10-CM

## 2020-07-04 NOTE — Group Note (Addendum)
Group Topic: Guilt/Shame  Group Date: 07/04/2020 Start Time:  9:00 AM End Time: 12:00 PM Facilitators: Mamie Nick, Counselor  Department: [facility]  Number of Participants: 2  Group Focus: affirmation, anger management, anxiety, communication, community group, coping skills, depression, dual diagnosis, family, feeling awareness/expression, forgiveness, impulsivity, problem solving, and relapse prevention Treatment Modality:  Cognitive Behavioral Therapy Interventions utilized were clarification, confrontation, exploration, and support Purpose: enhance coping skills, explore maladaptive thinking, express feelings, express irrational fears, improve communication skills, increase insight, regain self-worth, reinforce self-care, relapse prevention strategies, and trigger / craving management   Name: Curtis Ross Date of Birth: 12-18-1990  MR: [id number]    Level of Participation: active Quality of Participation: attentive and disruptive Interactions with others: gave feedback and intrusive Mood/Affect: irritable Triggers (if applicable): N/A Cognition: guilty Progress: Gaining Insight Response: Clt attended group today with an irritable disposition. Clt appeared to be under the influence. Toward the end of the group session, clt self-reported that he had been drinking the past 4 days due to familial triggers. He shared that he has been caring for his mother and it has been a significant trigger these past few days. Clt appeared to be struggling with feelings of guilt and shame. He verbalized that he believes he is "not powerless to anything" and is not willing to admit his powerlessness to alcohol. He was open and receptive to the feedback offered by the group leader and his peers. Client processed what he could do to stay sober - just for today - and he reported "I don't know right now". Clt was given suggestions such as changing his thinking patterns  and forgiving himself. Continue to monitor. Plan: follow-up needed  Patients Problems:  Patient Active Problem List   Diagnosis Date Noted  . GAD (generalized anxiety disorder) 05/28/2015  . Alcohol use disorder, severe, dependence (HCC) 05/26/2015     Family Program: Family present? No   Name of family member(s): N/A  UDS collected: No Results: N/A  AA/NA attended?: No  Sponsor?: No

## 2020-07-04 NOTE — Progress Notes (Signed)
   [facility] Follow-up Outpatient [facility]IOP Date: 07/04/2020  Admission Date:05/09/2020  Sobriety date: Slipping  Subjective: "I dont believe in that powerless stuff"  HPI : Counselor requested I speak to patient because he is observed to be drinking while attending group. Pt admits this and says he is drinking "to escape"father Has become overwhelmed again by hisbelief he must care for his sick mother ("She has n been in and out of hospital a lot") and legal problems including DWI.  When asked about how he ended up in IOP he avoids mentioning wanting to die. He  Admits not recalling how bad things were with his last drunk-ie not thinking the drink thru.  He does not understand the concept of powerlessness as it applies to taking the first drink. He says he is taking his Baclofen but"it doesnt work". He says "there will always be cravings and temptations"  Review of Systems: Psychiatric: Agitation: Yes Hallucination: No Depressed Mood: Intoxicated-no breathayizer available Smell of ETOH on breath  Insomnia: Rx Trazodone Hypersomnia: No Altered Concentration: No Feels Worthless: Esteem issues Grandiose Ideas: about his ability to control his alcohol use/consequences Belief In Special Powers: No New/Increased Substance Abuse: yes Compulsions: ETOH use  Neurologic: Headache: No Seizure: No Paresthesias: No  Current Medication  Mental Status Examination  Appearance: Alert: Yes Attention: good  Cooperative: Yes Eye Contact: Good Speech: Clear and coherent Psychomotor Activity: Normal Memory/Concentration: Normal/intact Oriented: person, place, time/date and situation Mood: Euthymic Affect: Appropriate and Congruent Thought Processes and Associations: Coherent and Intact Fund of Knowledge: Good Thought Content: WDL Insight: Good Judgement: Good  UDS: negative x 1   PDMP:12/20/2018 Hydrocodone 7.5     Diagnosis:  0 Alcohol use disorder, severe, dependence  (HCC) 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 relapse  Treatment Plan: Motivational interview questioning his thinking aroundhis use and return to use. Reinforced craving video showing DECREASED CRAVING NOT NO CRAVING and role of patient in assisting drug efficacy- Taught "Remember your last drunk Think the drink thru " technique when challenged with drinking thoughts Advised if he is unable to resume abstintnce to go to Detox       Maryjean Morn, PA-C

## 2020-07-06 ENCOUNTER — Ambulatory Visit (INDEPENDENT_AMBULATORY_CARE_PROVIDER_SITE_OTHER): Payer: No Payment, Other | Admitting: Behavioral Health

## 2020-07-06 ENCOUNTER — Other Ambulatory Visit: Payer: Self-pay

## 2020-07-06 DIAGNOSIS — F1020 Alcohol dependence, uncomplicated: Secondary | ICD-10-CM

## 2020-07-09 ENCOUNTER — Other Ambulatory Visit: Payer: Self-pay

## 2020-07-09 ENCOUNTER — Encounter (HOSPITAL_COMMUNITY): Payer: Self-pay | Admitting: Medical

## 2020-07-09 ENCOUNTER — Ambulatory Visit (INDEPENDENT_AMBULATORY_CARE_PROVIDER_SITE_OTHER): Payer: No Payment, Other | Admitting: Behavioral Health

## 2020-07-09 DIAGNOSIS — F10282 Alcohol dependence with alcohol-induced sleep disorder: Secondary | ICD-10-CM

## 2020-07-09 DIAGNOSIS — F1020 Alcohol dependence, uncomplicated: Secondary | ICD-10-CM

## 2020-07-09 DIAGNOSIS — F1410 Cocaine abuse, uncomplicated: Secondary | ICD-10-CM

## 2020-07-09 DIAGNOSIS — F17210 Nicotine dependence, cigarettes, uncomplicated: Secondary | ICD-10-CM

## 2020-07-09 DIAGNOSIS — Z811 Family history of alcohol abuse and dependence: Secondary | ICD-10-CM

## 2020-07-09 DIAGNOSIS — F1094 Alcohol use, unspecified with alcohol-induced mood disorder: Secondary | ICD-10-CM

## 2020-07-09 DIAGNOSIS — F1210 Cannabis abuse, uncomplicated: Secondary | ICD-10-CM

## 2020-07-09 NOTE — Progress Notes (Signed)
Patient ID: Curtis Ross, male   DOB: 1990/03/12, 30 y.o.   MRN: [id number] Brief encounter for Status check-claims doing well. Taking meds. No further ETOH use

## 2020-07-09 NOTE — Group Note (Signed)
Group Topic: Cognitive Distortion  Group Date: 07/06/2020 Start Time:  9:00 AM End Time: 12:00 PM Facilitators: Mamie Nick, Counselor  Department: [facility]  Number of Participants: 2  Group Focus: acceptance, affirmation, anxiety, coping skills, depression, family, feeling awareness/expression, forgiveness, relapse prevention, safety plan, self-awareness, and self-esteem Treatment Modality:  Cognitive Behavioral Therapy Interventions utilized were clarification, confrontation, and exploration Purpose: enhance coping skills, explore maladaptive thinking, express feelings, express irrational fears, improve communication skills, increase insight, regain self-worth, reinforce self-care, relapse prevention strategies, and trigger / craving management   Name: MICHIEL SIVLEY Date of Birth: [birth year]  MR: [id number]    Level of Participation: active Quality of Participation: attentive, cooperative, motivated and offered feedback Interactions with others: gave feedback Mood/Affect: appropriate Triggers (if applicable): N/A Cognition: coherent/clear, insightful and logical Progress: Moderate Response: Clt checked in by sharing his valley to be "This weekend was rough with the setback (slip). It was rough dealing with he guilt and shame". He reports he appreciated the support he receives from being apart of this group. He further explained his peak to be "I'm finally realizing that I ned to take it one day at a time. You can't force anything and especially not recovery." Clt identified his word of the day to be "serenity". Clt engaged well in group discussion.   Plan: follow-up needed  Patients Problems:  Patient Active Problem List   Diagnosis Date Noted  . GAD (generalized anxiety disorder) 05/28/2015  . Alcohol use disorder, severe, dependence (HCC) 05/26/2015     Family Program: Family present? No   Name of family member(s): N/A  UDS collected:  No Results: N/A  AA/NA attended?: No  Sponsor?: No

## 2020-07-09 NOTE — Group Note (Signed)
Group Topic: Feelings and Emotions  Group Date: 07/09/2020 Start Time:  9:00 AM End Time: 12:00 PM Facilitators: Mamie Nick, Counselor  Department: [facility]  Number of Participants: 2  Group Focus: acceptance, affirmation, anxiety, check in, coping skills, depression, dual diagnosis, family, forgiveness, and relapse prevention Treatment Modality:  Cognitive Behavioral Therapy Interventions utilized were clarification, confrontation, exploration, and group exercise Purpose: enhance coping skills, explore maladaptive thinking, express feelings, express irrational fears, improve communication skills, increase insight, regain self-worth, reinforce self-care, relapse prevention strategies, and trigger / craving management   Name: Curtis Ross Date of Birth: [birth year]  MR: [id number]    Level of Participation: active Quality of Participation: attentive, cooperative, motivated and offered feedback Interactions with others: gave feedback Mood/Affect: appropriate Triggers (if applicable): N/A Cognition: coherent/clear, insightful and logical Progress: Moderate Response: Clt checked in reporting his peaks to be "I had a pretty uneventful weekend. I went fishing with dad on Saturday. I watched basketball games. And my family and I grilled outside this weekend". He identified his valley as "recognizing that it was a nice weather this weekend and although he spent time with his family he still had fleeting thoughts of drinking. He shared that his word of the day is "gratitude". Plan: follow-up needed  Patients Problems:  Patient Active Problem List   Diagnosis Date Noted  . GAD (generalized anxiety disorder) 05/28/2015  . Alcohol use disorder, severe, dependence (HCC) 05/26/2015     Family Program: Family present? No   Name of family member(s): N/A  UDS collected: No Results: N/A  AA/NA attended?: No  Sponsor?: No

## 2020-07-11 ENCOUNTER — Ambulatory Visit (INDEPENDENT_AMBULATORY_CARE_PROVIDER_SITE_OTHER): Payer: No Payment, Other | Admitting: Behavioral Health

## 2020-07-11 ENCOUNTER — Other Ambulatory Visit: Payer: Self-pay

## 2020-07-11 DIAGNOSIS — F1020 Alcohol dependence, uncomplicated: Secondary | ICD-10-CM | POA: Diagnosis not present

## 2020-07-11 NOTE — Group Note (Signed)
Group Topic: Boundaries  Group Date: 07/11/2020 Start Time:  9:00 AM End Time: 12:00 PM Facilitators: Mamie Nick, Counselor  Department: [facility]  Number of Participants: 3  Group Focus: acceptance, affirmation, anger management, anxiety, check in, concentration, coping skills, depression, diagnosis education, dual diagnosis, family, relapse prevention, relaxation, and self-awareness Treatment Modality:  Cognitive Behavioral Therapy Interventions utilized were clarification, confrontation, exploration, and group exercise Purpose: enhance coping skills, explore maladaptive thinking, express feelings, express irrational fears, improve communication skills, increase insight, regain self-worth, reinforce self-care, relapse prevention strategies, and trigger / craving management   Name: Curtis Ross Date of Birth: [birth year]  MR: [id number]    Level of Participation: minimal Quality of Participation: offered feedback Interactions with others: gave feedback Triggers (if applicable): Clt was intoxicated Cognition: guilty Progress: None Response: Clt checked in by sharing that he had been spending time with family. He reports his valleys to be "if you're an addict the thought to use is always there". Clt appeared intoxicated and was pulled aside by the group leader, during break, to discuss his presentation. Clt self-reported to this writer that he drank alcohol last night. He also reported his urine drug screen would test positive for marijuana. Group leader discussed appropriateness for group and group expectations. He shared that he was triggered by having an overwhelming Tuesday with his caring for his mother. Clt was informed that he would be dismissed from group early today and he respectfully agreed. Plan: follow-up needed  Patients Problems:  Patient Active Problem List   Diagnosis Date Noted  . GAD (generalized anxiety disorder) 05/28/2015  .  Alcohol use disorder, severe, dependence (HCC) 05/26/2015     Family Program: Family present? No   Name of family member(s): N/A  UDS collected: No Results: N/A  AA/NA attended?: No  Sponsor?: No

## 2020-07-13 ENCOUNTER — Ambulatory Visit (HOSPITAL_COMMUNITY): Payer: Federal, State, Local not specified - Other | Admitting: Behavioral Health

## 2020-07-16 ENCOUNTER — Ambulatory Visit (INDEPENDENT_AMBULATORY_CARE_PROVIDER_SITE_OTHER): Payer: No Payment, Other | Admitting: Behavioral Health

## 2020-07-16 ENCOUNTER — Other Ambulatory Visit: Payer: Self-pay

## 2020-07-16 DIAGNOSIS — F1020 Alcohol dependence, uncomplicated: Secondary | ICD-10-CM

## 2020-07-16 NOTE — Group Note (Signed)
Group Topic: Cognitive Distortion  Group Date: 07/16/2020 Start Time:  9:00 AM End Time: 12:00 PM Facilitators: Mamie Nick, Counselor  Department: [facility]  Number of Participants: 3  Group Focus: acceptance, anxiety, check in, clarity of thought, communication, concentration, coping skills, depression, diagnosis education, dual diagnosis, family, feeling awareness/expression, forgiveness, impulsivity, leisure skills, personal responsibility, relapse prevention, and relaxation Treatment Modality:  Cognitive Behavioral Therapy Interventions utilized were clarification, confrontation, exploration, and group exercise Purpose: enhance coping skills, explore maladaptive thinking, express feelings, express irrational fears, improve communication skills, increase insight, regain self-worth, reinforce self-care, relapse prevention strategies, and trigger / craving management   Name: Curtis Ross Date of Birth: 05-Oct-1990  MR: [id number]    Level of Participation: active Quality of Participation: attentive, cooperative, motivated and offered feedback Interactions with others: gave feedback Mood/Affect: appropriate Triggers (if applicable): N/A Cognition: coherent/clear and insightful Progress: Gaining insight Response: Clt checked in by stating "I didn't have a good week last week. I did drink on Wednesday when I left group, but Wednesday was it. I hadn't drank since Wednesday. I had a lot of anxiety on Friday; wishing I should've came to group. I had an uneventful weekend - just kind of sat in my emotions. I'm way harder on myself and I often put expectations on my myself". Clt was insightful as he checked in and reflected on his choices and behaviors from the previous week. He further shared that "each day I'm feeling better". He participated well in the group activity by offering insightful verbal feedback. He shared that he word of the day is  "renaissance". Plan: follow-up needed  Patients Problems:  Patient Active Problem List   Diagnosis Date Noted  . GAD (generalized anxiety disorder) 05/28/2015  . Alcohol use disorder, severe, dependence (HCC) 05/26/2015     Family Program: Family present? No   Name of family member(s): N/A  UDS collected: No Results: N/A  AA/NA attended?: No  Sponsor?: No

## 2020-07-18 ENCOUNTER — Ambulatory Visit (HOSPITAL_COMMUNITY): Payer: Federal, State, Local not specified - Other | Admitting: Behavioral Health

## 2020-07-20 ENCOUNTER — Ambulatory Visit (HOSPITAL_COMMUNITY): Payer: Federal, State, Local not specified - Other | Admitting: Behavioral Health

## 2020-07-25 ENCOUNTER — Other Ambulatory Visit: Payer: Self-pay

## 2020-07-25 ENCOUNTER — Ambulatory Visit (INDEPENDENT_AMBULATORY_CARE_PROVIDER_SITE_OTHER): Payer: No Payment, Other | Admitting: Behavioral Health

## 2020-07-25 DIAGNOSIS — F1020 Alcohol dependence, uncomplicated: Secondary | ICD-10-CM

## 2020-07-25 NOTE — Group Note (Signed)
Group Topic: Cognitive Distortion  Group Date: 07/25/2020 Start Time:  9:00 AM End Time: 12:00 PM Facilitators: Mamie Nick, Counselor  Department: [facility]  Number of Participants: 2  Group Focus: acceptance, affirmation, anger management, anxiety, check in, clarity of thought, co-dependency, communication, coping skills, depression, family, impulsivity, leisure skills, personal responsibility, and relapse prevention Treatment Modality:  Cognitive Behavioral Therapy Interventions utilized were clarification, confrontation, exploration, story telling, and support Purpose: enhance coping skills, explore maladaptive thinking, express feelings, express irrational fears, improve communication skills, increase insight, regain self-worth, reinforce self-care, relapse prevention strategies, and trigger / craving management   Name: Curtis Ross Date of Birth: 1990-11-30  MR: [id number]    Level of Participation: active Quality of Participation: attentive, cooperative, defensive, offered feedback and oppositional Interactions with others: gave feedback Mood/Affect: depressed Triggers (if applicable): N/A Cognition: coherent/clear Progress: None Response: Clt checked-in by sharing "it wasn't a good week for me ... I hung out with an old girlfriend and it is a toxic relationship". Clt shared during group discussion that he drank alcohol over the weekend while with this old male friend. He shared that he "hates the fear of missing out and not having fun". He often appeared to be romanticizing his addiction by stating "It was fun and I had a lot of fun times when I use to drink ... it didn't always end fun but it started out fun". Clt was challenged to play the tap forward in order to recognize the consequences of his past drinking behaviors. Clt presented with ambivalence, willfulness, and resistance during group discussion today.  Plan: follow-up  needed  Patients Problems:  Patient Active Problem List   Diagnosis Date Noted  . GAD (generalized anxiety disorder) 05/28/2015  . Alcohol use disorder, severe, dependence (HCC) 05/26/2015     Family Program: Family present? No   Name of family member(s): N/A  UDS collected: No Results: N/A  AA/NA attended?: No  Sponsor?: No

## 2020-07-27 ENCOUNTER — Ambulatory Visit (HOSPITAL_COMMUNITY): Payer: Federal, State, Local not specified - Other

## 2020-07-30 ENCOUNTER — Ambulatory Visit (HOSPITAL_COMMUNITY): Payer: Federal, State, Local not specified - Other | Admitting: Behavioral Health

## 2020-08-01 ENCOUNTER — Ambulatory Visit (INDEPENDENT_AMBULATORY_CARE_PROVIDER_SITE_OTHER): Payer: No Payment, Other | Admitting: Behavioral Health

## 2020-08-01 ENCOUNTER — Other Ambulatory Visit: Payer: Self-pay

## 2020-08-01 DIAGNOSIS — F1020 Alcohol dependence, uncomplicated: Secondary | ICD-10-CM | POA: Diagnosis not present

## 2020-08-01 NOTE — Group Note (Signed)
Group Topic: General Coping Skills  Group Date: 08/01/2020 Start Time:  9:00 AM End Time: 12:00 PM Facilitators: Mamie Nick, Counselor  Department: [facility]  Number of Participants: 3  Group Focus: acceptance, affirmation, anxiety, check in, co-dependency, communication, coping skills, depression, family, feeling awareness/expression, and relapse prevention Treatment Modality:  Cognitive Behavioral Therapy Interventions utilized were clarification, confrontation, exploration, other AA meeting, and story telling Purpose: enhance coping skills, explore maladaptive thinking, express feelings, express irrational fears, improve communication skills, increase insight, regain self-worth, reinforce self-care, relapse prevention strategies, and trigger / craving management  Name: Curtis Ross Date of Birth: Nov 03, 1990  MR: [id number]    Level of Participation: moderate Quality of Participation: attentive and cooperative Interactions with others: gave feedback Mood/Affect: appropriate Triggers (if applicable): N/A Cognition: coherent/clear Progress: Gaining insight Response: Clt checked-in by sharing "I wasn't able to be here on Monday, my mom wasn't feeling well and I had to help care for 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 I have to meet my probation officer and I'm a little nervous". Clt rated his depression and anxiety both a 4/10, with 10 being the worst. Clt was attentive during group discussion. Continue to monitor. Plan: follow-up needed  Patients Problems:  Patient Active Problem List   Diagnosis Date Noted   GAD (generalized anxiety disorder) 05/28/2015   Alcohol use disorder, severe, dependence (HCC) 05/26/2015     Family Program: Family present? No   Name of family member(s): N/A  UDS collected: No Results: N/A  AA/NA attended?: No  Sponsor?: No

## 2020-08-03 ENCOUNTER — Ambulatory Visit (HOSPITAL_COMMUNITY): Payer: Federal, State, Local not specified - Other

## 2020-08-06 ENCOUNTER — Ambulatory Visit (HOSPITAL_COMMUNITY): Payer: Federal, State, Local not specified - Other

## 2020-08-08 ENCOUNTER — Ambulatory Visit (INDEPENDENT_AMBULATORY_CARE_PROVIDER_SITE_OTHER): Payer: No Payment, Other | Admitting: Licensed Clinical Social Worker

## 2020-08-08 ENCOUNTER — Other Ambulatory Visit: Payer: Self-pay

## 2020-08-08 DIAGNOSIS — F1020 Alcohol dependence, uncomplicated: Secondary | ICD-10-CM | POA: Diagnosis not present

## 2020-08-08 DIAGNOSIS — F1094 Alcohol use, unspecified with alcohol-induced mood disorder: Secondary | ICD-10-CM

## 2020-08-10 ENCOUNTER — Ambulatory Visit (HOSPITAL_COMMUNITY): Payer: Federal, State, Local not specified - Other

## 2020-08-13 ENCOUNTER — Ambulatory Visit (HOSPITAL_COMMUNITY): Payer: Federal, State, Local not specified - Other | Admitting: Behavioral Health

## 2020-08-14 NOTE — Progress Notes (Signed)
    Daily Group Progress Note  Program: CD-IOP   Group Time: 9am-10am Participation Level: Active Behavioral Response: Appropriate Type of Therapy: Psycho-education Group Topic: Psychoeducational presentation co-facilitated by Berna Spare. from [facility]. STI and HIV psychoeducation was provided to clients. Co-facilitator provided local resources for testing and treatment. Clients were provided with time to ask questions and discuss local resources.     Group Time: 10am-12pm Participation Level: Active Behavioral Response: Appropriate Type of Therapy: Process Group Topic: Process group: Clinician checked in with clients, assessing for SI/HI/psychosis and overall level of functioning including cravings, barriers to sobriety, and highlights when skills were successfully used. Clinician processed with client radical acceptance and control of self, not others behaviors and the effect on mood. Clinician challenged group members to focus on goal of conversations to avoid escalated interactions. Clients discussed "RECOVERY" questions with focus on identifying severe enough consequences to motivate changed behaviors, relationship caring contingent on sobriety. Clients provided support for others' progress and solutions to roadblocks. Clinician inquired about self-care activity to be completed before next group.   Summary: Client reported recent increase in depression and anxiety at 6 and 8 out of 10 with 10 being the worst. Client denied SI/HI did not appear psychotic or intoxicated at the time of session. Client noted a goal of keeping an open mind and actively engaging and sharing with group. Client shared lack of consequences and continued pros outweighing cons related to drinking. Client was receptive to other group members use of 'yet' to pair possible consequences of continued behavior. Client was receptive to psycho-educational information.   Family Program: Family present? No   Name  of family member(s): NA  UDS collected: No Results: negative  AA/NA attended?: No  Sponsor?: No   Curtis Ditty, LCSW

## 2020-08-15 ENCOUNTER — Ambulatory Visit (HOSPITAL_COMMUNITY): Payer: Federal, State, Local not specified - Other | Admitting: Behavioral Health

## 2020-08-17 ENCOUNTER — Ambulatory Visit (HOSPITAL_COMMUNITY): Payer: Federal, State, Local not specified - Other | Admitting: Behavioral Health

## 2020-08-20 ENCOUNTER — Ambulatory Visit (HOSPITAL_COMMUNITY): Payer: Federal, State, Local not specified - Other

## 2020-08-21 ENCOUNTER — Telehealth (HOSPITAL_COMMUNITY): Payer: Self-pay | Admitting: Behavioral Health

## 2020-08-21 NOTE — Telephone Encounter (Signed)
Patient came by asking to speak with S. Spease about re-entry into SAIOP group. Please call patient. Phone number confirmed.

## 2020-08-22 ENCOUNTER — Ambulatory Visit (HOSPITAL_COMMUNITY): Payer: Federal, State, Local not specified - Other

## 2020-08-24 ENCOUNTER — Ambulatory Visit (HOSPITAL_COMMUNITY): Payer: Federal, State, Local not specified - Other

## 2020-08-30 ENCOUNTER — Ambulatory Visit (HOSPITAL_COMMUNITY): Payer: Federal, State, Local not specified - Other | Admitting: Behavioral Health

## 2020-11-10 ENCOUNTER — Encounter (HOSPITAL_COMMUNITY): Payer: Self-pay

## 2020-11-10 ENCOUNTER — Emergency Department (HOSPITAL_COMMUNITY)
Admission: EM | Admit: 2020-11-10 | Discharge: 2020-11-10 | Disposition: A | Discharge status: Home / Self Care | Payer: Self-pay | Attending: Emergency Medicine | Admitting: Emergency Medicine

## 2020-11-10 ENCOUNTER — Other Ambulatory Visit: Payer: Self-pay

## 2020-11-10 ENCOUNTER — Emergency Department (HOSPITAL_COMMUNITY): Payer: Self-pay

## 2020-11-10 DIAGNOSIS — X501XXA Overexertion from prolonged static or awkward postures, initial encounter: Secondary | ICD-10-CM | POA: Insufficient documentation

## 2020-11-10 DIAGNOSIS — F17210 Nicotine dependence, cigarettes, uncomplicated: Secondary | ICD-10-CM | POA: Insufficient documentation

## 2020-11-10 DIAGNOSIS — M25562 Pain in left knee: Secondary | ICD-10-CM | POA: Insufficient documentation

## 2020-11-10 MED ORDER — ACETAMINOPHEN 325 MG PO TABS
650.0000 mg | ORAL_TABLET | Freq: Once | ORAL | Status: AC
  Administered 2020-11-10: 650 mg via ORAL
  Filled 2020-11-10: qty 2

## 2020-11-10 NOTE — Discharge Instructions (Addendum)
You were seen in the ER today for your knee pain.  Your physical exam and vital signs are reassuring.  There is no broken bones or dislocations on your x-ray.  Suspect you have an injury to the ligaments and soft tissues of your knee.  You have been placed in a supportive brace and given crutches to use for comfort.  Below is contact information for Dr. Shon Baton, with whom you should follow-up in the outpatient setting for further evaluation of your knee pain.  You may use Tylenol or ibuprofen as needed as well as ice the joint.  Return to the ER with any new numbness, tingling, weakness in the hand, or any other new severe symptom.

## 2020-11-10 NOTE — ED Provider Notes (Signed)
[facility]-EMERGENCY DEPT Provider Note   CSN: [id number] Arrival date & time: 11/10/20  0920     History Chief Complaint  Patient presents with   Knee Pain    Curtis Ross is a 30 y.o. male who presents with concern for left knee pain that started yesterday.  Patient states that he has injured the knee in the past but did not seek care at that time.  Since that time has had intermittent weakness in the knee.  States that he stepped up bed and twisted incorrectly and felt a popping sensation in his medial knee subsequently unable to bear weight on it.  Denies any numbness, tingling, weakness in the foot or the leg.   I personally reviewed this patient's medical records.  He has history of generalized anxiety disorder and alcohol abuse.  He is not on any medications every day.  HPI     Past Medical History:  Diagnosis Date   Medical history non-contributory     Patient Active Problem List   Diagnosis Date Noted   GAD (generalized anxiety disorder) 05/28/2015   Alcohol use disorder, severe, dependence (HCC) 05/26/2015    Past Surgical History:  Procedure Laterality Date   TYMPANOSTOMY TUBE PLACEMENT         Family History  Problem Relation Age of Onset   Alcoholism Paternal Grandmother     Social History   Tobacco Use   Smoking status: Every Day    Packs/day: 1.00    Types: Cigarettes   Smokeless tobacco: Never  Vaping Use   Vaping Use: Never used  Substance Use Topics   Alcohol use: Not Currently   Drug use: Not Currently    Home Medications Prior to Admission medications   Medication Sig Start Date End Date Taking? Authorizing Provider  ibuprofen (ADVIL,MOTRIN) 200 MG tablet Take 800 mg by mouth every 6 (six) hours as needed for moderate pain.    [provider]  traZODone (DESYREL) 50 MG tablet Take 1 tablet (50 mg total) by mouth at bedtime as needed and may repeat dose one time if needed for sleep. 05/09/20 07/08/20   Court Joy, PA-C    Allergies    Patient has no known allergies.  Review of Systems   Review of Systems  Constitutional: Negative.   HENT: Negative.    Respiratory: Negative.    Cardiovascular: Negative.   Gastrointestinal: Negative.   Genitourinary: Negative.   Musculoskeletal:  Positive for arthralgias.  Neurological: Negative.   Hematological: Negative.    Physical Exam Updated Vital Signs BP 134/83 (BP Location: Left Arm)   Pulse 69   Temp 98.2 F (36.8 C) (Oral)   Resp 19   SpO2 99%   Physical Exam Vitals and nursing note reviewed.  HENT:     Head: Normocephalic and atraumatic.  Eyes:     General: No scleral icterus.       Right eye: No discharge.        Left eye: No discharge.     Conjunctiva/sclera: Conjunctivae normal.  Pulmonary:     Effort: Pulmonary effort is normal.  Musculoskeletal:     Right hip: Normal.     Left hip: Normal.     Right upper leg: Normal.     Left upper leg: Normal.     Right knee: Normal.     Left knee: Swelling present. No deformity, erythema, bony tenderness or crepitus. Tenderness present over the medial joint line. Normal alignment.  Instability Tests: Anterior drawer test negative. Posterior drawer test negative.     Right lower leg: Normal. No edema.     Left lower leg: Normal. No edema.     Right ankle: Normal.     Right Achilles Tendon: Normal.     Left ankle: Normal.     Left Achilles Tendon: Normal.     Right foot: Normal.     Left foot: Normal.  Skin:    General: Skin is warm and dry.     Capillary Refill: Capillary refill takes less than 2 seconds.  Neurological:     General: No focal deficit present.     Mental Status: He is alert.  Psychiatric:        Mood and Affect: Mood normal.    ED Results / Procedures / Treatments   Labs (all labs ordered are listed, but only abnormal results are displayed) Labs Reviewed - No data to display  EKG None  Radiology DG Knee Complete 4 Views Left  Result  Date: 11/10/2020 CLINICAL DATA:  Pain EXAM: LEFT KNEE - COMPLETE 4+ VIEW COMPARISON:  Knee radiographs 08/12/2018 FINDINGS: There is no acute fracture or dislocation. Knee alignment is normal. The joint spaces are preserved. There is no effusion. IMPRESSION: Normal knee radiographs. Electronically Signed   By: Lesia Hausen M.D.   On: 11/10/2020 11:40    Procedures Procedures   Medications Ordered in ED Medications  acetaminophen (TYLENOL) tablet 650 mg (has no administration in time range)    ED Course  I have reviewed the triage vital signs and the nursing notes.  Pertinent labs & imaging results that were available during my care of the patient were reviewed by me and considered in my medical decision making (see chart for details).    MDM Rules/Calculators/A&P                         30 year old male presents for concern for left knee pain after standing and twisting yesterday and feeling a popping sensation in his medial knee.  Differential diagnosis includes is limited to acute fracture dislocation, ligamentous injury, effusion, contusion, meniscal injury.  Vital signs are normal intake.  Cardiopulmonary exam is normal, musculoskeletal exam revealed tenderness palpation over the medial joint line of the left knee with very mild swelling without deformity.  No laxity of the ACL/PCL.  Worsening pain in the medial joint line with varus movement of the knee.  2+ pedal pulses bilaterally.  No lower extremity edema.  Plain film of the knee was obtained which was negative for acute fracture dislocation.  Suspect ligamentous injury or meniscal injury.  Will place in supportive knee sleeve and provide crutches.  Recommend outpatient orthopedic follow-up, will provide contact information.  May use Tylenol ibuprofen as needed as well as ice application.  No further work-up warranted needed this time given reassuring physical exam and imaging.  [name] voiced understanding of his medical  evaluation and treatment plan.  Each of his questions answered to his expressed satisfaction.  Return precautions were given.  Patient is well-appearing, stable, and appropriate for discharge at this time.  This chart was dictated using voice recognition software, Dragon. Despite the best efforts of this provider to proofread and correct errors, errors may still occur which can change documentation meaning.  Final Clinical Impression(s) / ED Diagnoses Final diagnoses:  None    Rx / DC Orders ED Discharge Orders     None  Sherrilee Gilles 11/10/20 1148    Lorre Nick, MD 11/10/20 1445

## 2020-11-10 NOTE — ED Triage Notes (Signed)
Pt reports pain in left knee since yesterday. States he felt a pop in it after getting out of bed.

## 2020-11-14 ENCOUNTER — Other Ambulatory Visit: Payer: Self-pay

## 2020-11-14 ENCOUNTER — Ambulatory Visit (INDEPENDENT_AMBULATORY_CARE_PROVIDER_SITE_OTHER): Payer: Self-pay | Admitting: Family Medicine

## 2020-11-14 VITALS — BP 124/86 | HR 90 | Temp 96.0°F | Wt 178.8 lb

## 2020-11-14 DIAGNOSIS — M25462 Effusion, left knee: Secondary | ICD-10-CM

## 2020-11-14 DIAGNOSIS — M25562 Pain in left knee: Secondary | ICD-10-CM

## 2020-11-14 DIAGNOSIS — F1020 Alcohol dependence, uncomplicated: Secondary | ICD-10-CM

## 2020-11-14 NOTE — Progress Notes (Addendum)
   Subjective:    Patient ID: Curtis Ross, male    DOB: Mar 07, 1990, 30 y.o.   MRN: [id number]  HPI He is here for follow-up on recent injury to his knee.  He injured his knee on September 16 and did go to the hospital the next day because of the pain.  He describes an injury of getting out of bed backwards and standing up and feeling knee pain.  He was unable to walk after that.  He was seen in the emergency room.  The ER record was reviewed.  He was given a brace. He indicates that he did injure in knee several years prior to that but did not seek medical care for that.  He was apparently functional after that initial knee injury. When I asked how he was doing he then mentioned the fact that he is going regularly to both AA and NA.  And has been alcohol free for at least a month. Review of Systems     Objective:   Physical Exam Left knee exam does show an effusion present.  He is tender over the medial joint line.  Anterior drawer is negative.  McMurray's testing causes medial joint line pain but no clicking. Medial and lateral collateral ligaments intact.      Assessment & Plan:  Acute pain of left knee  Alcohol use disorder, severe, dependence (HCC)  Effusion of left knee I explained with the effusion and the exam, he probably has meniscal damage.  It could have been that it was a reinjury of the previous knee injury.  I explained that I think is reasonable to treat this conservatively, reevaluated in 3 months and see if we can do some rehab on this.  If this is unsuccessful or he continues have significant pain, orthopedic referral will be made. I congratulated him on his work in regard to going to [employer] and NA and encouraged him to continue with that.

## 2020-11-14 NOTE — Progress Notes (Signed)
k

## 2020-11-14 NOTE — Patient Instructions (Signed)
Heat for 20 minutes 3 times per day.  2 Tylenol 4 times per day..  2 Aleve twice per day can also be taken.  Use the wrap as needed

## 2020-12-06 ENCOUNTER — Other Ambulatory Visit: Payer: Self-pay

## 2020-12-06 ENCOUNTER — Ambulatory Visit: Payer: Self-pay | Admitting: Family Medicine

## 2020-12-06 ENCOUNTER — Encounter: Payer: Self-pay | Admitting: Family Medicine

## 2020-12-06 VITALS — BP 114/74 | HR 97 | Temp 96.0°F | Ht 70.0 in | Wt 177.6 lb

## 2020-12-06 DIAGNOSIS — F1020 Alcohol dependence, uncomplicated: Secondary | ICD-10-CM

## 2020-12-06 DIAGNOSIS — M25562 Pain in left knee: Secondary | ICD-10-CM

## 2020-12-06 NOTE — Progress Notes (Signed)
   Subjective:    Patient ID: Curtis Ross, male    DOB: [birth year], 30 y.o.   MRN: [id number]  HPI He is here for recheck on his left knee.  He states that this is doing much better and having very little discomfort. He also recently had an alcohol relapse and again reinforced the fact that he cannot have just 1 alcoholic beverage.  Review of Systems     Objective:   Physical Exam Left knee exam shows a minimal effusion.  Only slight tenderness over the posterior medial joint line.  McMurray's testing was slightly uncomfortable.  Anterior drawer negative       Assessment & Plan:  Acute pain of left knee  Alcohol use disorder, severe, dependence (HCC) At this point I think conservative care needs to be continued.  No further intervention needed.  He was comfortable with that.

## 2021-01-14 ENCOUNTER — Other Ambulatory Visit: Payer: Self-pay

## 2021-01-14 ENCOUNTER — Ambulatory Visit (INDEPENDENT_AMBULATORY_CARE_PROVIDER_SITE_OTHER): Payer: No Payment, Other | Admitting: Licensed Clinical Social Worker

## 2021-01-14 ENCOUNTER — Encounter (HOSPITAL_COMMUNITY): Payer: Self-pay | Admitting: Licensed Clinical Social Worker

## 2021-01-14 DIAGNOSIS — F411 Generalized anxiety disorder: Secondary | ICD-10-CM | POA: Diagnosis not present

## 2021-01-14 DIAGNOSIS — F331 Major depressive disorder, recurrent, moderate: Secondary | ICD-10-CM
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
## 2021-01-15 ENCOUNTER — Encounter (HOSPITAL_COMMUNITY): Payer: Self-pay | Admitting: Psychiatry

## 2021-01-15 ENCOUNTER — Telehealth (INDEPENDENT_AMBULATORY_CARE_PROVIDER_SITE_OTHER): Payer: No Payment, Other | Admitting: Psychiatry

## 2021-01-15 DIAGNOSIS — F411 Generalized anxiety disorder: Secondary | ICD-10-CM

## 2021-01-15 DIAGNOSIS — F1090 Alcohol use, unspecified, uncomplicated: Secondary | ICD-10-CM

## 2021-01-15 DIAGNOSIS — Z789 Other specified health status: Secondary | ICD-10-CM | POA: Diagnosis not present

## 2021-01-15 DIAGNOSIS — F331 Major depressive disorder, recurrent, moderate: Secondary | ICD-10-CM | POA: Diagnosis not present

## 2021-01-15 MED ORDER — GABAPENTIN 300 MG PO CAPS: 300.0000 mg | ORAL_CAPSULE | Freq: Three times a day (TID) | ORAL | 3 refills | Status: DC

## 2021-01-15 MED ORDER — MIRTAZAPINE 15 MG PO TABS
15.0000 mg | ORAL_TABLET | Freq: Every day | ORAL | 3 refills | Status: DC
Start: 2021-01-15 — End: 2021-04-17

## 2021-01-15 NOTE — Progress Notes (Signed)
Psychiatric Initial Adult Assessment  Virtual Visit via Video Note  I connected with Curtis Ross on 01/15/21 at 10:00 AM EST by a video enabled telemedicine application and verified that I am speaking with the correct person using two identifiers.  Location: Patient: Home Provider: Clinic   I discussed the limitations of evaluation and management by telemedicine and the availability of in person appointments. The patient expressed understanding and agreed to proceed.  I provided 40 minutes of non-face-to-face time during this encounter.   Patient Identification: Curtis Ross MRN:  [id number] Date of Evaluation:  01/15/2021 Referral Source: Walk-in Chief Complaint:  "My anxiety has been really high this year" Visit Diagnosis:    ICD-10-CM   1. GAD (generalized anxiety disorder)  F41.1 gabapentin (NEURONTIN) 300 MG capsule    mirtazapine (REMERON) 15 MG tablet    2. Major depressive disorder, recurrent episode, moderate (HCC)  F33.1 mirtazapine (REMERON) 15 MG tablet    3. Alcohol use  Z78.9 gabapentin (NEURONTIN) 300 MG capsule      History of Present Illness:  30 year old male seen today for initial psychiatric evaluation. He walked into the clinic for medication management. He has a psychiatric history of alcohol use, depression, anxiety, and depression. Patient notes that he used cocaine years ago but denies current use. Currently he is not managed on meditations but notes that he has tried Trazodone and Celexa in the past which her reports were somewhat effective.  Today he is well groomed, pleasant, cooperative, and engaged in eye contact. Today he notes that he has been more anxious this last year. He informed Clinical research associate that stressors includes the health of his mother who he cares for and who has COPD. He also notes that he is unemployed and looking for a job. Patient notes that a final stressor is maintaining his sobriety. He notes that he was sober from alcohol for  a moth and relapsed. He notes that he has now been sober for a week and attends AA meetings when he can. An Audit assessment was done on 11/22 by patients therapist and he scored a 0.  Patient notes that he was at the [facility] group at [facility] and is now in the process off finding another. Patient given resources by his therapist.   Patient notes that the above worsens his anxiety and depression. A GAD 7 was conducted on 11/22 by patients therapist and patient scored a 13. A PHQ 9 was also conducted and patient scored a 14. He endorses adequate appetite and poor sleep (noting he sleeps 4-5 hours nightly). Today he denies SI/HI/VAH, mania, or paranoia.   Patient informed writer that he has mild knee pain 2/10 but notes that he is able to cope with it.  Today he is agreeable to starting Mirtazapine 15 mg to help manage anxiety, depression, and sleep. He is also agreeable to start gabapentin 300 mg three times daily to help manage mood, anxiety, and alcohol dependence. Potential side effects of medication and risks vs benefits of treatment vs non-treatment were explained and discussed. All questions were answered. She will follow up with outpatient counseling for therapy. No other concerns noted at this time.    Associated Signs/Symptoms: Depression Symptoms:  depressed mood, insomnia, psychomotor agitation, fatigue, feelings of worthlessness/guilt, difficulty concentrating, hopelessness, anxiety, panic attacks, (Hypo) Manic Symptoms:  Flight of Ideas, Irritable Mood, Anxiety Symptoms:  Excessive Worry, Psychotic Symptoms:   Denies PTSD Symptoms: NA  Past Psychiatric History: Alcohol use, depression, and anxiety  Previous Psychotropic Medications:  Pobation  Substance Abuse History in the last 12 months:  Yes.    Consequences of Substance Abuse: Legal Consequences:  on   Past Medical History:  Past Medical History:  Diagnosis Date   Medical history non-contributory     Past Surgical  History:  Procedure Laterality Date   TYMPANOSTOMY TUBE PLACEMENT      Family Psychiatric History: Paternal Grandmother alcohol use and sister depression   Family History:  Family History  Problem Relation Age of Onset   Alcoholism Paternal Grandmother     Social History:   Social History   Socioeconomic History   Marital status: Single    Spouse name: Not on file   Number of children: Not on file   Years of education: Not on file   Highest education level: Not on file  Occupational History   Not on file  Tobacco Use   Smoking status: Every Day    Packs/day: 0.50    Types: Cigarettes   Smokeless tobacco: Never  Vaping Use   Vaping Use: Never used  Substance and Sexual Activity   Alcohol use: Not Currently    Comment: Pt Sober since Oct 20th   Drug use: Not Currently   Sexual activity: Not on file  Other Topics Concern   Not on file  Social History Narrative   Not on file   Social Determinants of Health   Financial Resource Strain: High Risk   Difficulty of Paying Living Expenses: Hard  Food Insecurity: No Food Insecurity   Worried About Programme researcher, broadcasting/film/video in the Last Year: Never true   Ran Out of Food in the Last Year: Never true  Transportation Needs: No Transportation Needs   Lack of Transportation (Medical): No   Lack of Transportation (Non-Medical): No  Physical Activity: Insufficiently Active   Days of Exercise per Week: 3 days   Minutes of Exercise per Session: 30 min  Stress: Stress Concern Present   Feeling of Stress : Rather much  Social Connections: Socially Isolated   Frequency of Communication with Friends and Family: More than three times a week   Frequency of Social Gatherings with Friends and Family: Twice a week   Attends Religious Services: Never   Database administrator or Organizations: No   Attends Banker Meetings: Never   Marital Status: Separated    Additional Social History: Patient resides in [place] with his  mother. He has been dating his girlfriend for 8 months. He has no children and is unemployed. He notes that he has been sober from alcohol for 8 months and cocaine for years. He reports that he smokes cigarettes daily.   Allergies:  No Known Allergies  Metabolic Disorder Labs: No results found for: HGBA1C, MPG No results found for: PROLACTIN Lab Results  Component Value Date   CHOL 174 05/24/2019   TRIG 141 05/24/2019   HDL 48 05/24/2019   CHOLHDL 3.6 05/24/2019   LDLCALC 101 (H) 05/24/2019   Lab Results  Component Value Date   TSH 0.721 05/28/2015    Therapeutic Level Labs: No results found for: LITHIUM No results found for: CBMZ No results found for: VALPROATE  Current Medications: Current Outpatient Medications  Medication Sig Dispense Refill   gabapentin (NEURONTIN) 300 MG capsule Take 1 capsule (300 mg total) by mouth 3 (three) times daily. 90 capsule 3   mirtazapine (REMERON) 15 MG tablet Take 1 tablet (15 mg total) by mouth at bedtime. 30 tablet  3   ibuprofen (ADVIL,MOTRIN) 200 MG tablet Take 800 mg by mouth every 6 (six) hours as needed for moderate pain.     No current facility-administered medications for this visit.    Musculoskeletal: Strength & Muscle Tone:  Unable to assess due to telehealth visit Gait & Station:  Unable to assess due to telehealth visit Patient leans: N/A  Psychiatric Specialty Exam: Review of Systems  There were no vitals taken for this visit.There is no height or weight on file to calculate BMI.  General Appearance: Well Groomed  Eye Contact:  Good  Speech:  Clear and Coherent and Normal Rate  Volume:  Normal  Mood:  Anxious and Depressed  Affect:  Appropriate and Congruent  Thought Process:  Coherent, Goal Directed, and Linear  Orientation:  Full (Time, Place, and Person)  Thought Content:  WDL and Logical  Suicidal Thoughts:  No  Homicidal Thoughts:  No  Memory:  Immediate;   Good Recent;   Good Remote;   Good  Judgement:   Good  Insight:  Good  Psychomotor Activity:  Normal  Concentration:  Concentration: Good and Attention Span: Good  Recall:  Good  Fund of Knowledge:Good  Language: Good  Akathisia:  No  Handed:  Right  AIMS (if indicated):  not done  Assets:  Communication Skills Desire for Improvement Financial Resources/Insurance Housing Intimacy Leisure Time Physical Health Social Support  ADL's:  Intact  Cognition: WNL  Sleep:  Fair   Screenings: AUDIT    Flowsheet Row [facility]  Alcohol Use Disorder Identification Test Final Score (AUDIT) 10      GAD-7    Flowsheet Row Counselor from 01/14/2021 in [facility]  Total GAD-7 Score 13      PHQ2-9    Flowsheet Row Counselor from 01/14/2021 in [facility] Office Visit from 12/06/2020 in [facility] Office Visit from 05/24/2019 in [facility] Office Visit from 09/28/2014 in [facility]  PHQ-2 Total Score 4 0 2 5  PHQ-9 Total Score 14 -- 3 20      Flowsheet Row Counselor from 01/14/2021 in [facility] ED from 11/10/2020 in [facility] from 04/23/2020 in [facility]  C-SSRS RISK CATEGORY No Risk No Risk Low Risk       Assessment and Plan: Patient endorses symptoms of anxiety, depression, and insomnia. He notes that he is also trying to maintain his sobriety from alcohol (sober 1 week). Today he is agreeable to starting Mirtazapine 15 mg nightly to help manage anxiety and depression. He is also agreeable to start gabapentin 300 mg three times daily to help manage anxiety and alcohol dependence.   1. GAD (generalized anxiety disorder)  Start- gabapentin (NEURONTIN) 300 MG capsule; Take 1 capsule (300 mg total) by mouth 3 (three) times daily.  Dispense: 90 capsule; Refill:  3 Start-- mirtazapine (REMERON) 15 MG tablet; Take 1 tablet (15 mg total) by mouth at bedtime.  Dispense: 30 tablet; Refill: 3  2. Major depressive disorder, recurrent episode, moderate (HCC)  Start-- mirtazapine (REMERON) 15 MG tablet; Take 1 tablet (15 mg total) by mouth at bedtime.  Dispense: 30 tablet; Refill: 3  3. Alcohol use  Start-- gabapentin (NEURONTIN) 300 MG capsule; Take 1 capsule (300 mg total) by mouth 3 (three) times daily.  Dispense: 90 capsule; Refill: 3   Follow up in 3  months Follow up with therapy  Shanna Cisco, NP 11/22/202210:36 AM

## 2021-03-14 ENCOUNTER — Telehealth: Payer: Self-pay | Admitting: Family Medicine

## 2021-03-14 NOTE — Telephone Encounter (Signed)
Done KH 

## 2021-03-14 NOTE — Telephone Encounter (Signed)
Pt called and states that he is admitting himself to [facility] in Patient facility. He states that facility is requesting medical clearance from his PCP. Per pt they want to make sure he is ok and n not suffering from withdrawals. Pt states he has not used in 3 weeks. Pt was advised that an appt may be needed but requested a note be put back. Please advise pt at [phone].

## 2021-03-18 ENCOUNTER — Institutional Professional Consult (permissible substitution): Payer: Self-pay | Admitting: Family Medicine

## 2021-03-18 ENCOUNTER — Other Ambulatory Visit (HOSPITAL_COMMUNITY): Payer: Self-pay | Admitting: Medical

## 2021-03-21 NOTE — Telephone Encounter (Signed)
Needs appt

## 2021-04-10 ENCOUNTER — Telehealth (HOSPITAL_COMMUNITY): Payer: No Payment, Other | Admitting: Psychiatry

## 2021-04-17 ENCOUNTER — Other Ambulatory Visit (HOSPITAL_COMMUNITY): Payer: Self-pay | Admitting: Psychiatry

## 2021-04-17 ENCOUNTER — Other Ambulatory Visit (HOSPITAL_COMMUNITY): Payer: Self-pay | Admitting: Medical

## 2021-04-17 DIAGNOSIS — F411 Generalized anxiety disorder: Secondary | ICD-10-CM

## 2021-04-17 DIAGNOSIS — F331 Major depressive disorder, recurrent, moderate: Secondary | ICD-10-CM

## 2021-04-17 DIAGNOSIS — Z789 Other specified health status: Secondary | ICD-10-CM

## 2021-04-17 DIAGNOSIS — F1090 Alcohol use, unspecified, uncomplicated: Secondary | ICD-10-CM

## 2021-04-18 NOTE — Telephone Encounter (Signed)
Has Remeron from PCP

## 2021-05-13 ENCOUNTER — Other Ambulatory Visit (HOSPITAL_COMMUNITY): Payer: Self-pay | Admitting: Psychiatry

## 2021-05-13 DIAGNOSIS — F331 Major depressive disorder, recurrent, moderate: Secondary | ICD-10-CM

## 2021-05-13 DIAGNOSIS — F411 Generalized anxiety disorder: Secondary | ICD-10-CM

## 2021-05-13 DIAGNOSIS — Z789 Other specified health status: Secondary | ICD-10-CM

## 2021-05-14 ENCOUNTER — Telehealth (HOSPITAL_COMMUNITY): Payer: No Payment, Other | Admitting: Psychiatry

## 2021-05-15 ENCOUNTER — Telehealth (INDEPENDENT_AMBULATORY_CARE_PROVIDER_SITE_OTHER): Payer: No Payment, Other | Admitting: Psychiatry

## 2021-05-15 DIAGNOSIS — F331 Major depressive disorder, recurrent, moderate: Secondary | ICD-10-CM | POA: Diagnosis not present

## 2021-05-15 DIAGNOSIS — F411 Generalized anxiety disorder: Secondary | ICD-10-CM

## 2021-05-15 NOTE — Progress Notes (Signed)
BH MD/PA/NP OP Progress Note ? ?05/15/2021 4:54 PM ?Curtis PilgrimBenjamin L Ross  ?MRN:  [id number] ? ?Virtual Visit via Video Note ? ?I connected with Curtis Ross on 05/15/21 at  4:30 PM EDT by a video enabled telemedicine application and verified that I am speaking with the correct person using two identifiers. ? ?Location: ?Patient: home ?Provider: offsite ?  ?I discussed the limitations of evaluation and management by telemedicine and the availability of in person appointments. The patient expressed understanding and agreed to proceed. ? ?  ?I discussed the assessment and treatment plan with the patient. The patient was provided an opportunity to ask questions and all were answered. The patient agreed with the plan and demonstrated an understanding of the instructions. ?  ?The patient was advised to call back or seek an in-person evaluation if the symptoms worsen or if the condition fails to improve as anticipated. ? ?I provided 10 minutes of non-face-to-face time during this encounter. ? ? ?Mcneil Sobericely Pilot Prindle, NP  ? ?Chief Complaint: No chief complaint on file. ? ?HPI: Curtis PummelBenjamin Ross is a 31 year old [facility] follow-up psychiatric evaluation.  He has a psychiatric history of generalized anxiety disorder and major depressive disorder.  His symptoms are managed with gabapentin 300 mg 3 times daily and mirtazapine 15 mg at bedtime.  Patient reports medication compliance and denies adverse effects.  He reports continued anxiety and depressed mood.  Patient is in agreement with increase in mirtazapine today.  Medication benefits versus risk discussed. ? ?Patient also has a history of alcohol use disorder, severe dependence and reports that he recently [facility] for [facility] but tested Covid+ and was discharged and returned for an additional week, released on May 06, 2021. Patient reports a relapse yesterday, drinking 8 malt liquor beverages.   He reports attending AA meetings at times. ? ?Patient is alert and oriented x4, pleasant and willing to engage.  He appears appropriately dressed for the weather and well-groomed.  Patient reports anxious and depressed mood today.  He reports good sleep and appetite.  Patient denies suicidal homicidal ideations, delusional thought or auditory or visual hallucinations.  Patient reports minimal paranoia with thoughts that "the same people are out to get me".  ? ?Visit Diagnosis:  ?  ICD-10-CM   ?1. Major depressive disorder, recurrent episode, moderate (HCC)  F33.1   ?  ?2. GAD (generalized anxiety disorder)  F41.1   ?  ? ? ?Past Psychiatric History: Generalized anxiety disorder and major depressive disorder and alcohol use disorder. ? ?Past Medical History:  ?Past Medical History:  ?Diagnosis Date  ? Medical history non-contributory   ?  ?Past Surgical History:  ?Procedure Laterality Date  ? TYMPANOSTOMY TUBE PLACEMENT    ? ? ?Family Psychiatric History: See below ? ?Family History:  ?Family History  ?Problem Relation Age of Onset  ? Alcoholism Paternal Grandmother   ? ? ?Social History:  ?Social History  ? ?Socioeconomic History  ? Marital status: Single  ?  Spouse name: Not on file  ? Number of children: Not on file  ? Years of education: Not on file  ? Highest education level: Not on file  ?Occupational History  ? Not on file  ?Tobacco Use  ? Smoking status: Every Day  ?  Packs/day: 0.50  ?  Types: Cigarettes  ? Smokeless tobacco: Never  ?Vaping Use  ? Vaping Use: Never used  ?Substance and Sexual Activity  ? Alcohol  use: Not Currently  ?  Comment: Pt Sober since Oct 20th  ? Drug use: Not Currently  ? Sexual activity: Not on file  ?Other Topics Concern  ? Not on file  ?Social History Narrative  ? Not on file  ? ?Social Determinants of Health  ? ?Financial Resource Strain: High Risk  ? Difficulty of Paying Living Expenses: Hard  ?Food Insecurity: No Food Insecurity  ? Worried About Programme researcher, broadcasting/film/video in the Last  Year: Never true  ? Ran Out of Food in the Last Year: Never true  ?Transportation Needs: No Transportation Needs  ? Lack of Transportation (Medical): No  ? Lack of Transportation (Non-Medical): No  ?Physical Activity: Insufficiently Active  ? Days of Exercise per Week: 3 days  ? Minutes of Exercise per Session: 30 min  ?Stress: Stress Concern Present  ? Feeling of Stress : Rather much  ?Social Connections: Socially Isolated  ? Frequency of Communication with Friends and Family: More than three times a week  ? Frequency of Social Gatherings with Friends and Family: Twice a week  ? Attends Religious Services: Never  ? Active Member of Clubs or Organizations: No  ? Attends Banker Meetings: Never  ? Marital Status: Separated  ? ? ?Allergies: No Known Allergies ? ?Metabolic Disorder Labs: ?No results found for: HGBA1C, MPG ?No results found for: PROLACTIN ?Lab Results  ?Component Value Date  ? CHOL 174 05/24/2019  ? TRIG 141 05/24/2019  ? HDL 48 05/24/2019  ? CHOLHDL 3.6 05/24/2019  ? LDLCALC 101 (H) 05/24/2019  ? ?Lab Results  ?Component Value Date  ? TSH 0.721 05/28/2015  ? ? ?Therapeutic Level Labs: ?No results found for: LITHIUM ?No results found for: VALPROATE ?No components found for:  CBMZ ? ?Current Medications: ?Current Outpatient Medications  ?Medication Sig Dispense Refill  ? gabapentin (NEURONTIN) 300 MG capsule TAKE 1 CAPSULE BY MOUTH THREE TIMES DAILY 90 capsule 0  ? ibuprofen (ADVIL,MOTRIN) 200 MG tablet Take 800 mg by mouth every 6 (six) hours as needed for moderate pain.    ? mirtazapine (REMERON) 15 MG tablet TAKE 1 TABLET BY MOUTH AT BEDTIME 30 tablet 0  ? ?No current facility-administered medications for this visit.  ? ? ? ?Musculoskeletal: ?Strength & Muscle Tone: N/A virtual visit ?Gait & Station: N/A virtual visit ?Patient leans: N/A ? ?Psychiatric Specialty Exam: ?Review of Systems  ?Psychiatric/Behavioral:  Positive for dysphoric mood. Negative for decreased concentration,  hallucinations, self-injury and suicidal ideas. The patient is nervous/anxious.   ?All other systems reviewed and are negative.  ?There were no vitals taken for this visit.There is no height or weight on file to calculate BMI.  ?General Appearance: Well Groomed  ?Eye Contact:  Good  ?Speech:  Clear and Coherent  ?Volume:  Normal  ?Mood:  Depressed  ?Affect:  Congruent  ?Thought Process:  Goal Directed  ?Orientation:  Full (Time, Place, and Person)  ?Thought Content: Logical   ?Suicidal Thoughts:  No  ?Homicidal Thoughts:  No  ?Memory: Good  ?Judgement: Good  ?Insight: Good  ?Psychomotor Activity: N/A  ?Concentration: Good  ?Recall: Good  ?Fund of Knowledge: Good  ?Language: Good  ?Akathisia: N/A  ?Handed: Right  ?AIMS (if indicated): Not done  ?Assets:  Communication Skills ?Desire for Improvement  ?ADL's:  Intact  ?Cognition: WNL  ?Sleep:  Good  ? ?Screenings: ?AUDIT   ? ?Flowsheet Row [facility]  ?Alcohol Use Disorder Identification Test Final  Score (AUDIT) 10  ? ?  ? ?GAD-7   ? ?Flowsheet Row Counselor from 01/14/2021 in [facility]  ?Total GAD-7 Score 13  ? ?  ? ?PHQ2-9   ? ?Flowsheet Row Counselor from 01/14/2021 in [facility] Office Visit from 12/06/2020 in [facility] Office Visit from 05/24/2019 in [facility] Office Visit from 09/28/2014 in [facility]  ?PHQ-2 Total Score 4 0 2 5  ?PHQ-9 Total Score 14 -- 3 20  ? ?  ? ?Flowsheet Row Counselor from 01/14/2021 in [facility] ED from 11/10/2020 in [facility] from 04/23/2020 in [facility]  ?C-SSRS RISK CATEGORY No Risk No Risk Low Risk  ? ?  ? ? ? ?Assessment and Plan: Milas Schappell is a 31 year old male presenting to [facility] behavioral health outpatient for follow-up psychiatric  evaluation.  He has a psychiatric history of generalized anxiety disorder and major depressive disorder.  His symptoms are managed with gabapentin 300 mg 3 times daily and mirtazapine 15 mg at bedtime.  Patient r

## 2021-06-24 ENCOUNTER — Telehealth (HOSPITAL_COMMUNITY): Payer: No Payment, Other | Admitting: Psychiatry

## 2021-06-24 ENCOUNTER — Encounter (HOSPITAL_COMMUNITY): Payer: Self-pay

## 2021-07-03 ENCOUNTER — Other Ambulatory Visit (HOSPITAL_COMMUNITY): Payer: Self-pay | Admitting: *Deleted

## 2021-07-03 DIAGNOSIS — F411 Generalized anxiety disorder: Secondary | ICD-10-CM

## 2021-07-03 DIAGNOSIS — Z789 Other specified health status: Secondary | ICD-10-CM

## 2021-07-04 ENCOUNTER — Other Ambulatory Visit (HOSPITAL_COMMUNITY): Payer: Self-pay | Admitting: Psychiatry

## 2021-07-04 ENCOUNTER — Telehealth (HOSPITAL_COMMUNITY): Payer: Self-pay

## 2021-07-04 MED ORDER — GABAPENTIN 300 MG PO CAPS: 300.0000 mg | ORAL_CAPSULE | Freq: Three times a day (TID) | ORAL | 0 refills | Status: DC

## 2021-07-04 NOTE — Telephone Encounter (Signed)
Medication refill request - Telephone call with pt's Mother, after she left a message requesitng a new Gabapentin order for pt be sent into pt's Corporate treasurer.  Collateral reported pt's Mirtazapine had an order but not his Gabapentin as was last prescribed on 05/15/21 with no refills.  Patient returns on 07/10/21 but needs order prior to next appointment.  Agreed to send request to covering provider.  ?

## 2021-07-04 NOTE — Progress Notes (Signed)
Gabapentin refilled to bridge patient until his attending psychiatric provider is able to resume care. ?

## 2021-07-10 ENCOUNTER — Telehealth (INDEPENDENT_AMBULATORY_CARE_PROVIDER_SITE_OTHER): Payer: No Payment, Other | Admitting: Psychiatry

## 2021-07-10 DIAGNOSIS — F411 Generalized anxiety disorder: Secondary | ICD-10-CM

## 2021-07-10 DIAGNOSIS — F331 Major depressive disorder, recurrent, moderate: Secondary | ICD-10-CM | POA: Diagnosis not present

## 2021-07-10 DIAGNOSIS — Z789 Other specified health status: Secondary | ICD-10-CM

## 2021-07-10 MED ORDER — MIRTAZAPINE 45 MG PO TABS: 45.0000 mg | ORAL_TABLET | Freq: Every day | ORAL | 0 refills | Status: DC

## 2021-07-10 MED ORDER — GABAPENTIN 300 MG PO CAPS: 300.0000 mg | ORAL_CAPSULE | Freq: Three times a day (TID) | ORAL | 0 refills | Status: DC

## 2021-07-10 NOTE — Progress Notes (Signed)
BH MD/PA/NP OP Progress Note ? ?07/10/2021 9:32 AM ?Corena PilgrimBenjamin L Stangelo  ?MRN:  [id number] ? ? ?Virtual Visit via Telephone Note ? ?I connected with Corena PilgrimBenjamin L Decaprio on 07/10/21 at  9:00 AM EDT by telephone and verified that I am speaking with the correct person using two identifiers. ? ?Location: ?Patient: home ?Provider: offsite ?  ?I discussed the limitations, risks, security and privacy concerns of performing an evaluation and management service by telephone and the availability of in person appointments. I also discussed with the patient that there may be a patient responsible charge related to this service. The patient expressed understanding and agreed to proceed. ? ? ?  ?I discussed the assessment and treatment plan with the patient. The patient was provided an opportunity to ask questions and all were answered. The patient agreed with the plan and demonstrated an understanding of the instructions. ?  ?The patient was advised to call back or seek an in-person evaluation if the symptoms worsen or if the condition fails to improve as anticipated. ? ?I provided 10 minutes of non-face-to-face time during this encounter. ? ? ?Mcneil Sobericely Heath Tesler, NP  ? ?Chief Complaint: Medication management ? ?HPI: Tawni PummelBenjamin Nagele is a 31 year old male [facility] follow-up psychiatric evaluation.  Patient has a psychiatric history of generalized anxiety disorder, alcohol dependence and major depressive disorder.  His symptoms are managed with gabapentin 300 mg 3 times daily and mirtazapine 30 mg daily at bedtime.  Patient reports medication compliance and denies adverse medication reactions.  Patient reports medications are somewhat effective but he has continued depressed moods at times and feeling like people are "trying to get over" on him.  Patient is agreeable to increasing mirtazapine to 45 mg at bedtime.  Medication benefits versus risks discussed. ? ?Patient is alert  oriented x4, calm, pleasant and willing to engage.  He reports depressed mood but good sleep and appetite.  Patient denies suicidal or homicidal ideations, delusional thought or auditory or visual hallucinations. ? ? ?Visit Diagnosis:  ?  ICD-10-CM   ?1. GAD (generalized anxiety disorder)  F41.1   ?  ?2. Major depressive disorder, recurrent episode, moderate (HCC)  F33.1   ?  ? ? ?Past Psychiatric History: Alcohol use disorder, GAD, major depressive disorder ? ?Past Medical History:  ?Past Medical History:  ?Diagnosis Date  ? Medical history non-contributory   ?  ?Past Surgical History:  ?Procedure Laterality Date  ? TYMPANOSTOMY TUBE PLACEMENT    ? ? ?Family Psychiatric History: See below ? ?Family History:  ?Family History  ?Problem Relation Age of Onset  ? Alcoholism Paternal Grandmother   ? ? ?Social History:  ?Social History  ? ?Socioeconomic History  ? Marital status: Single  ?  Spouse name: Not on file  ? Number of children: Not on file  ? Years of education: Not on file  ? Highest education level: Not on file  ?Occupational History  ? Not on file  ?Tobacco Use  ? Smoking status: Every Day  ?  Packs/day: 0.50  ?  Types: Cigarettes  ? Smokeless tobacco: Never  ?Vaping Use  ? Vaping Use: Never used  ?Substance and Sexual Activity  ? Alcohol use: Not Currently  ?  Comment: Pt Sober since Oct 20th  ? Drug use: Not Currently  ? Sexual activity: Not on file  ?Other Topics Concern  ? Not on file  ?Social History Narrative  ? Not on file  ? ?Social Determinants of Health  ? ?  Financial Resource Strain: High Risk  ? Difficulty of Paying Living Expenses: Hard  ?Food Insecurity: No Food Insecurity  ? Worried About Programme researcher, broadcasting/film/video in the Last Year: Never true  ? Ran Out of Food in the Last Year: Never true  ?Transportation Needs: No Transportation Needs  ? Lack of Transportation (Medical): No  ? Lack of Transportation (Non-Medical): No  ?Physical Activity: Insufficiently Active  ? Days of Exercise per Week: 3 days  ?  Minutes of Exercise per Session: 30 min  ?Stress: Stress Concern Present  ? Feeling of Stress : Rather much  ?Social Connections: Socially Isolated  ? Frequency of Communication with Friends and Family: More than three times a week  ? Frequency of Social Gatherings with Friends and Family: Twice a week  ? Attends Religious Services: Never  ? Active Member of Clubs or Organizations: No  ? Attends Banker Meetings: Never  ? Marital Status: Separated  ? ? ?Allergies: No Known Allergies ? ?Metabolic Disorder Labs: ?No results found for: HGBA1C, MPG ?No results found for: PROLACTIN ?Lab Results  ?Component Value Date  ? CHOL 174 05/24/2019  ? TRIG 141 05/24/2019  ? HDL 48 05/24/2019  ? CHOLHDL 3.6 05/24/2019  ? LDLCALC 101 (H) 05/24/2019  ? ?Lab Results  ?Component Value Date  ? TSH 0.721 05/28/2015  ? ? ?Therapeutic Level Labs: ?No results found for: LITHIUM ?No results found for: VALPROATE ?No components found for:  CBMZ ? ?Current Medications: ?Current Outpatient Medications  ?Medication Sig Dispense Refill  ? mirtazapine (REMERON) 30 MG tablet Take 1 tablet (30 mg total) by mouth at bedtime. 30 tablet 1  ? gabapentin (NEURONTIN) 300 MG capsule Take 1 capsule (300 mg total) by mouth 3 (three) times daily. 90 capsule 0  ? ibuprofen (ADVIL,MOTRIN) 200 MG tablet Take 800 mg by mouth every 6 (six) hours as needed for moderate pain.    ? ?No current facility-administered medications for this visit.  ? ? ? ?Musculoskeletal: ?Strength & Muscle Tone: N/A virtual visit ?Gait & Station: N/A ?Patient leans: N/A ? ?Psychiatric Specialty Exam: ?Review of Systems  ?Psychiatric/Behavioral:  Positive for dysphoric mood. Negative for hallucinations, self-injury and suicidal ideas.   ?All other systems reviewed and are negative.  ?There were no vitals taken for this visit.There is no height or weight on file to calculate BMI.  ?General Appearance: NA  ?Eye Contact:  NA  ?Speech:  Clear and Coherent  ?Volume:  Normal   ?Mood:  Depressed  ?Affect:  NA  ?Thought Process:  Coherent  ?Orientation:  Full (Time, Place, and Person)  ?Thought Content: Logical   ?Suicidal Thoughts:  No  ?Homicidal Thoughts:  No  ?Memory:  good  ?Judgement:  Good  ?Insight:  Good  ?Psychomotor Activity:  NA  ?Concentration:  good  ?Recall:  Good  ?Fund of Knowledge: Good  ?Language: Good  ?Akathisia:  NA  ?Handed:  Right  ?AIMS (if indicated): not done  ?Assets:  Communication Skills ?Desire for Improvement  ?ADL's:  Intact  ?Cognition: WNL  ?Sleep:  Good  ? ?Screenings: ?AUDIT   ? ?Flowsheet Row [facility]  ?Alcohol Use Disorder Identification Test Final Score (AUDIT) 10  ? ?  ? ?GAD-7   ? ?Flowsheet Row Counselor from 01/14/2021 in [facility]  ?Total GAD-7 Score 13  ? ?  ? ?PHQ2-9   ? ?Flowsheet Row Counselor from 01/14/2021 in [facility] Office Visit from 12/06/2020 in [facility] Office Visit from 05/24/2019 in [facility] Office Visit from 09/28/2014 in [facility]  ?PHQ-2 Total Score 4 0 2 5  ?PHQ-9 Total Score 14 -- 3 20  ? ?  ? ?Flowsheet Row Counselor from 01/14/2021 in [facility] ED from 11/10/2020 in [facility] from 04/23/2020 in [facility]  ?C-SSRS RISK CATEGORY No Risk No Risk Low Risk  ? ?  ? ? ? ?Assessment and Plan:  Gordan Grell is a 31 year old male presenting to [facility] behavioral health outpatient for follow-up psychiatric evaluation.  Patient has a psychiatric history of generalized anxiety disorder, alcohol dependence and major depressive disorder.  His symptoms are managed with gabapentin 300 mg 3 times daily and mirtazapine 30 mg daily at bedtime.  Patient reports medication compliance and denies adverse medication reactions.  Patient reports  medications are somewhat effective but he has continued depressed moods at times and feeling like people are "trying to get over" on him.  Patient is agreeable to increasing mirtazapine to 45 mg at bedtime.  Medication bene

## 2021-08-01 ENCOUNTER — Telehealth (HOSPITAL_COMMUNITY): Payer: Self-pay | Admitting: *Deleted

## 2021-08-01 ENCOUNTER — Other Ambulatory Visit (HOSPITAL_COMMUNITY): Payer: Self-pay | Admitting: Psychiatry

## 2021-08-01 DIAGNOSIS — F411 Generalized anxiety disorder: Secondary | ICD-10-CM

## 2021-08-01 DIAGNOSIS — F331 Major depressive disorder, recurrent, moderate: Secondary | ICD-10-CM

## 2021-08-01 DIAGNOSIS — Z789 Other specified health status: Secondary | ICD-10-CM

## 2021-08-01 MED ORDER — GABAPENTIN 300 MG PO CAPS: 300.0000 mg | ORAL_CAPSULE | Freq: Three times a day (TID) | ORAL | 3 refills | Status: DC

## 2021-08-01 MED ORDER — MIRTAZAPINE 45 MG PO TABS: 45.0000 mg | ORAL_TABLET | Freq: Every day | ORAL | 3 refills | Status: DC

## 2021-08-01 NOTE — Telephone Encounter (Signed)
Rx sent Refill Request: mirtazapine (REMERON) 45 MG tablet Take 1 tablet (45 mg total) by mouth at bedtime

## 2021-08-01 NOTE — Telephone Encounter (Signed)
Rx REFILL REQUEST: gabapentin (NEURONTIN) 300 MG capsule Take 1 capsule (300 mg total) by mouth 3 (three) times daily

## 2021-08-01 NOTE — Telephone Encounter (Signed)
Medication refilled and sent to preferred pharmacy

## 2021-08-21 ENCOUNTER — Encounter (HOSPITAL_COMMUNITY): Payer: Self-pay

## 2021-08-21 ENCOUNTER — Telehealth (HOSPITAL_COMMUNITY): Payer: No Payment, Other | Admitting: Psychiatry

## 2021-10-30 ENCOUNTER — Encounter: Payer: Self-pay | Admitting: Internal Medicine

## 2021-12-03 ENCOUNTER — Encounter: Payer: Self-pay | Admitting: Internal Medicine

## 2021-12-16 ENCOUNTER — Encounter: Payer: Self-pay | Admitting: Internal Medicine

## 2022-04-23 ENCOUNTER — Telehealth (INDEPENDENT_AMBULATORY_CARE_PROVIDER_SITE_OTHER): Payer: Medicaid Other | Admitting: Family Medicine

## 2022-04-23 ENCOUNTER — Encounter: Payer: Self-pay | Admitting: Family Medicine

## 2022-04-23 DIAGNOSIS — F1010 Alcohol abuse, uncomplicated: Secondary | ICD-10-CM

## 2022-04-23 NOTE — Progress Notes (Signed)
   Subjective:    Patient ID: Curtis Ross, male    DOB: [birth year], 32 y.o.   MRN: [id number]  HPI Documentation for virtual audio and video telecommunications through [place] encounter: The patient was located at home. 2 patient identifiers used.  The provider was located in the office. The patient did consent to this visit and is aware of possible charges through their insurance for this visit. The other persons participating in this telemedicine service were none.5 Time spent on call was 5 minutes and in review of previous records >20 minutes total for counseling and coordination of care. This virtual service is not related to other E/M service within previous 7 days.  Today's visit is to get clearance for him to start an intensive outpatient therapy program for his alcohol abuse.  He has been involved this in the past and did get some benefit but apparently the person in charge moved on he would like to get reinvolved in this.  He states that his last alcohol consumption was Saturday.  He experiences no withdrawal symptoms at the present time.  Review of Systems     Objective:   Physical Exam Alert and in no distress otherwise not examined.  His affect was appropriate.       Assessment & Plan:  Alcohol abuse I do not think that he needs an inpatient detox prior to starting into the outpatient program.  I will write a letter and send it to [employer].

## 2022-04-24 ENCOUNTER — Telehealth: Payer: Self-pay | Admitting: Family Medicine

## 2022-04-24 NOTE — Telephone Encounter (Signed)
Letter for [employer] written and emailed to [email or web address]

## 2022-05-28 ENCOUNTER — Other Ambulatory Visit: Payer: Self-pay | Admitting: Sports Medicine

## 2022-05-28 DIAGNOSIS — M25562 Pain in left knee: Secondary | ICD-10-CM

## 2022-06-20 ENCOUNTER — Other Ambulatory Visit: Payer: Self-pay

## 2022-07-08 ENCOUNTER — Ambulatory Visit
Admission: RE | Admit: 2022-07-08 | Discharge: 2022-07-08 | Disposition: A | Discharge status: Home / Self Care | Payer: Medicaid Other | Source: Ambulatory Visit | Attending: Sports Medicine | Admitting: Sports Medicine

## 2022-07-08 DIAGNOSIS — M25562 Pain in left knee: Secondary | ICD-10-CM

## 2023-01-21 IMAGING — CR DG KNEE COMPLETE 4+V*L*
4 series · 4 of 4 positions shown · non-contrast
Comparison: Knee radiographs 08/12/2018

CLINICAL DATA: Pain

EXAM:
LEFT KNEE - COMPLETE 4+ VIEW

[t knee ap left]
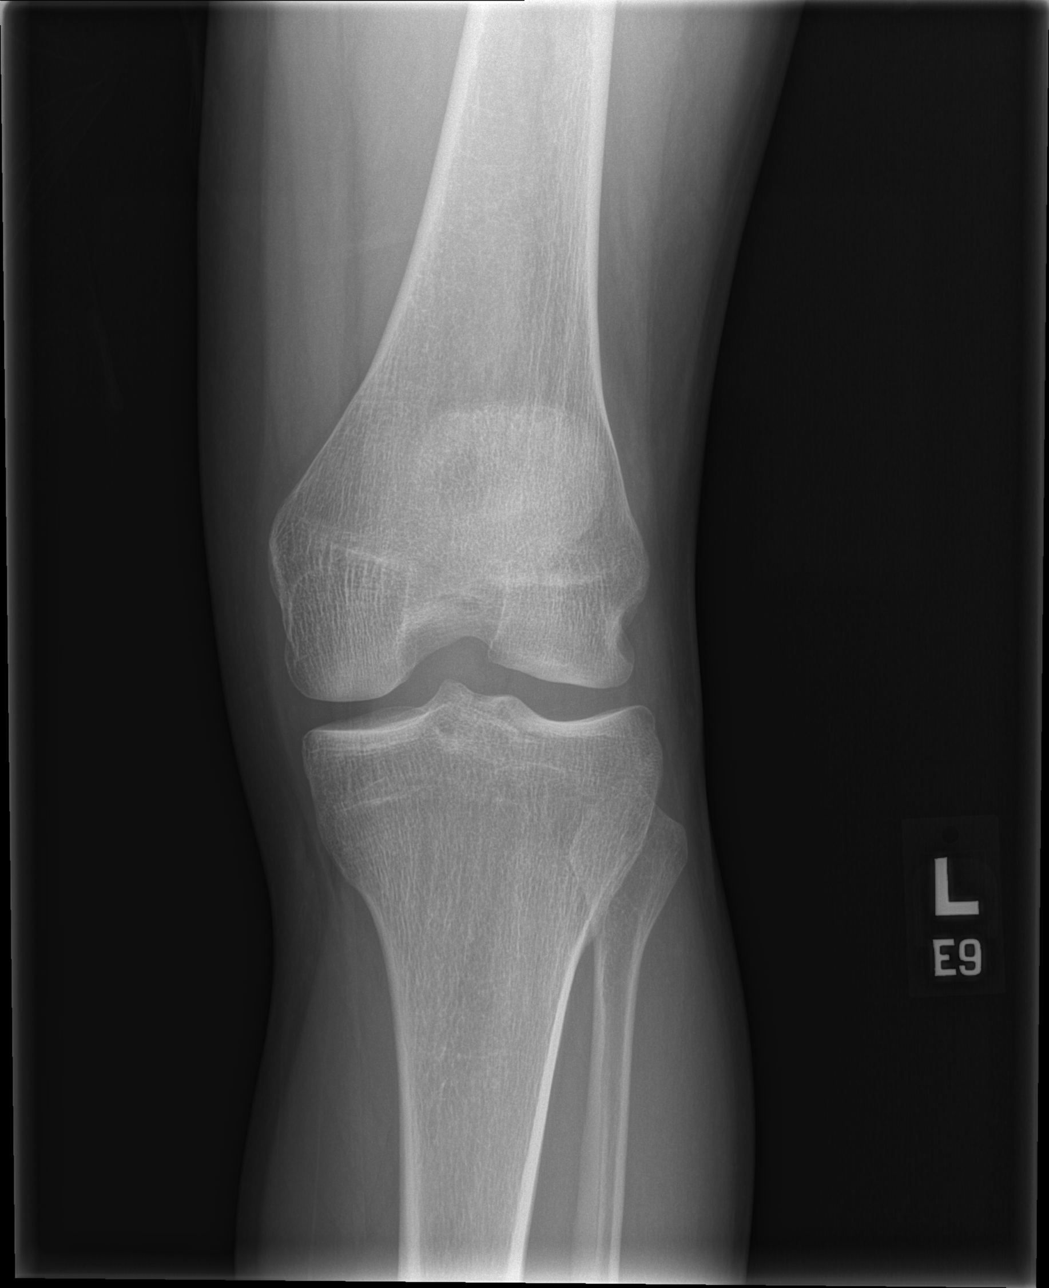

[t knee obl left (1 of 2)]
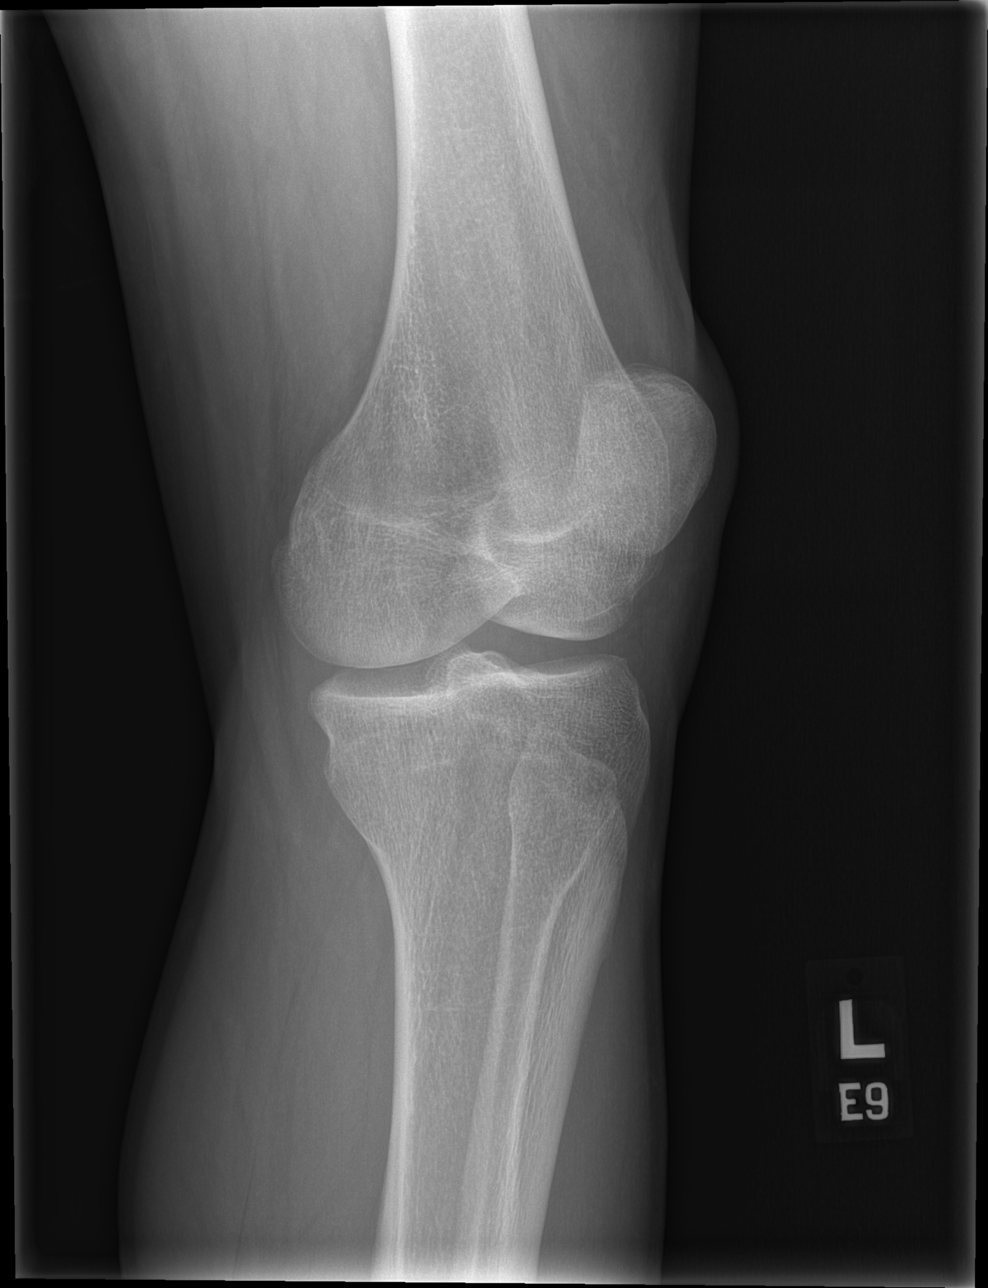

[t knee obl left (2 of 2)]
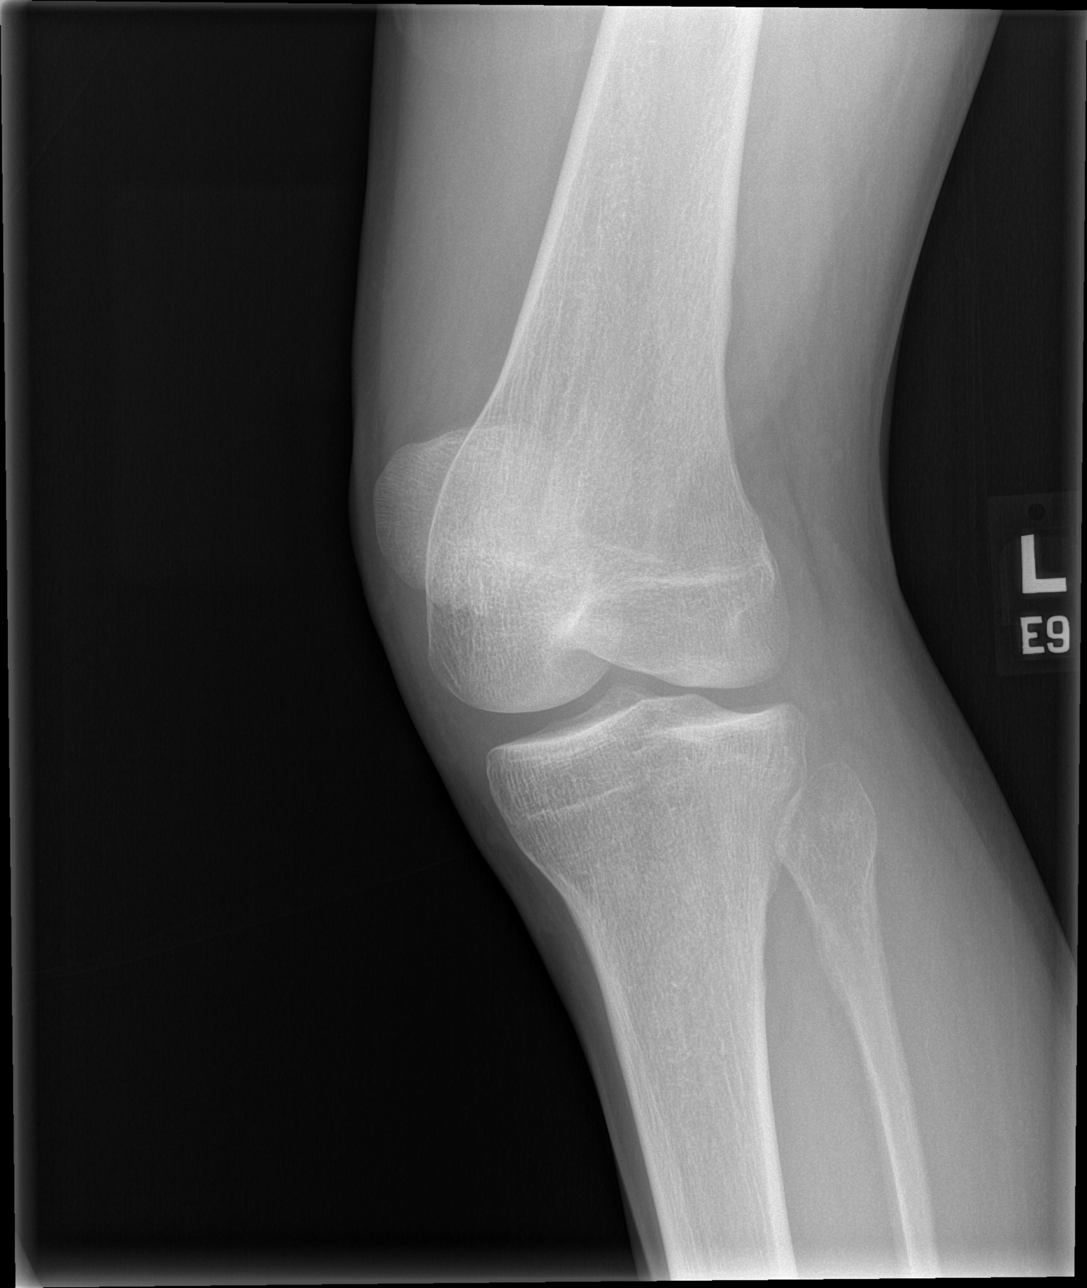

[t knee lat left]
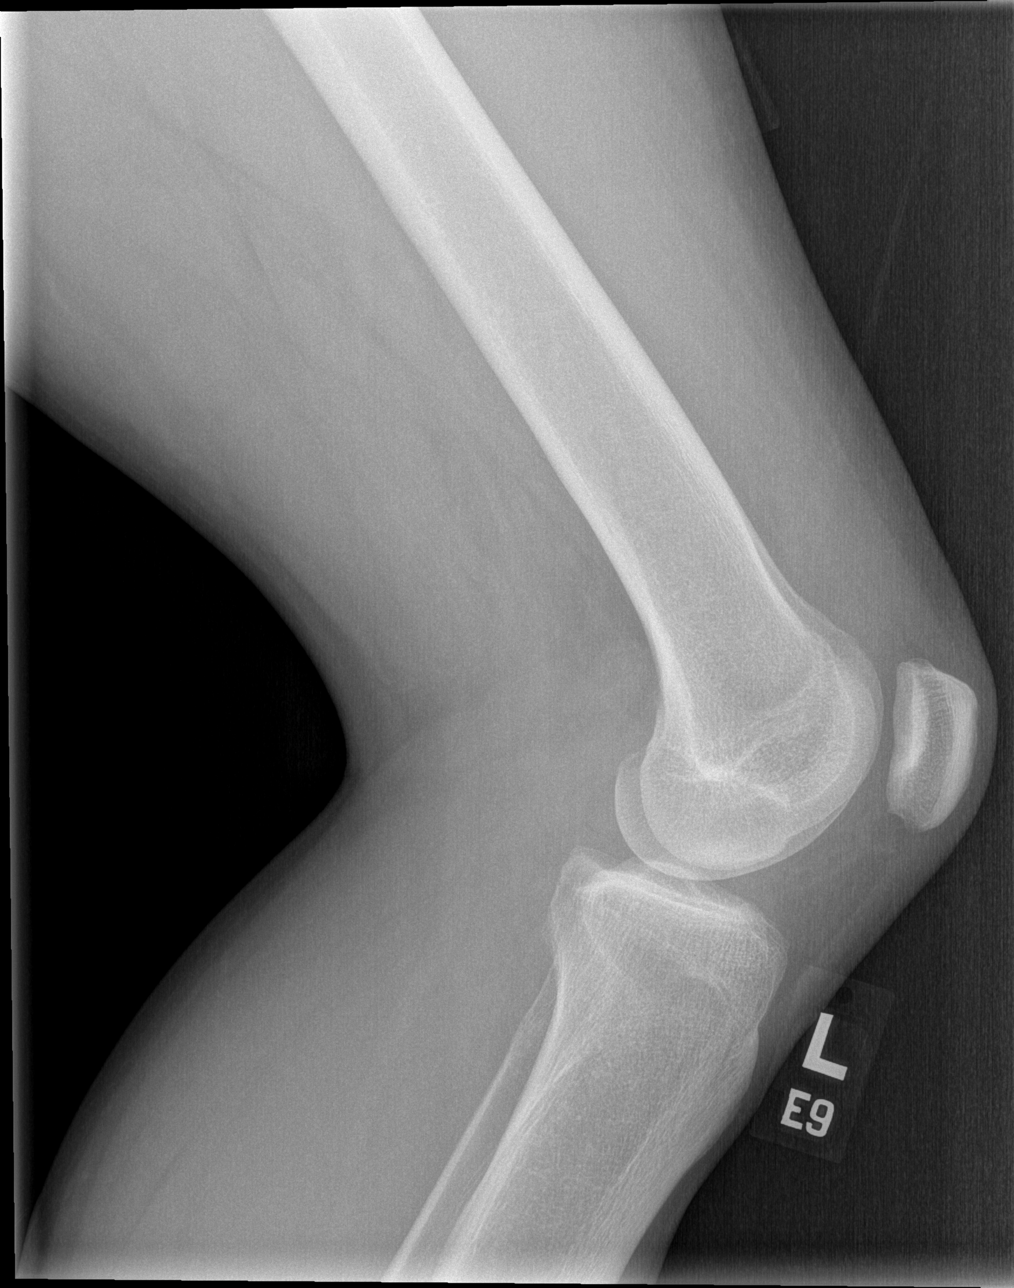

[4 of 4 positions shown; findings below may reference images not displayed]

FINDINGS: There is no acute fracture or dislocation. Knee alignment is normal.
The joint spaces are preserved. There is no effusion.
IMPRESSION: Normal knee radiographs.

## 2023-04-21 ENCOUNTER — Encounter: Payer: Self-pay | Admitting: Internal Medicine

## 2023-05-09 DIAGNOSIS — Z79899 Other long term (current) drug therapy: Secondary | ICD-10-CM | POA: Insufficient documentation

## 2023-05-09 DIAGNOSIS — F1290 Cannabis use, unspecified, uncomplicated: Secondary | ICD-10-CM | POA: Insufficient documentation

## 2023-05-09 DIAGNOSIS — R45851 Suicidal ideations: Secondary | ICD-10-CM | POA: Insufficient documentation

## 2023-05-09 DIAGNOSIS — F332 Major depressive disorder, recurrent severe without psychotic features: Secondary | ICD-10-CM | POA: Insufficient documentation

## 2023-05-09 DIAGNOSIS — F1010 Alcohol abuse, uncomplicated: Secondary | ICD-10-CM | POA: Insufficient documentation

## 2023-05-09 DIAGNOSIS — R440 Auditory hallucinations: Secondary | ICD-10-CM | POA: Insufficient documentation

## 2023-05-09 DIAGNOSIS — Z9151 Personal history of suicidal behavior: Secondary | ICD-10-CM | POA: Insufficient documentation

## 2023-05-10 ENCOUNTER — Ambulatory Visit (HOSPITAL_COMMUNITY)
Admission: EM | Admit: 2023-05-10 | Discharge: 2023-05-10 | Disposition: A | Discharge status: Other Acute Inpatient Hospital | Payer: MEDICAID | Attending: Urology | Admitting: Urology

## 2023-05-10 ENCOUNTER — Other Ambulatory Visit (HOSPITAL_COMMUNITY)
Admission: EM | Admit: 2023-05-10 | Discharge: 2023-05-10 | Disposition: A | Discharge status: Home / Self Care | Payer: MEDICAID | Attending: Psychiatry | Admitting: Psychiatry

## 2023-05-10 DIAGNOSIS — F331 Major depressive disorder, recurrent, moderate: Secondary | ICD-10-CM | POA: Insufficient documentation

## 2023-05-10 DIAGNOSIS — F1020 Alcohol dependence, uncomplicated: Secondary | ICD-10-CM | POA: Diagnosis not present

## 2023-05-10 DIAGNOSIS — Z9151 Personal history of suicidal behavior: Secondary | ICD-10-CM | POA: Diagnosis not present

## 2023-05-10 DIAGNOSIS — F1090 Alcohol use, unspecified, uncomplicated: Secondary | ICD-10-CM

## 2023-05-10 DIAGNOSIS — F332 Major depressive disorder, recurrent severe without psychotic features: Secondary | ICD-10-CM | POA: Diagnosis not present

## 2023-05-10 DIAGNOSIS — F1010 Alcohol abuse, uncomplicated: Secondary | ICD-10-CM | POA: Diagnosis not present

## 2023-05-10 DIAGNOSIS — F1290 Cannabis use, unspecified, uncomplicated: Secondary | ICD-10-CM | POA: Diagnosis not present

## 2023-05-10 LAB — RAPID URINE DRUG SCREEN, HOSP PERFORMED
Amphetamines: NOT DETECTED
Barbiturates: NOT DETECTED
Benzodiazepines: NOT DETECTED
Cocaine: NOT DETECTED
Opiates: NOT DETECTED
Tetrahydrocannabinol: POSITIVE — AB

## 2023-05-10 LAB — COMPREHENSIVE METABOLIC PANEL
ALT: 26 U/L (ref 0–44)
AST: 22 U/L (ref 15–41)
Albumin: 3.9 g/dL (ref 3.5–5.0)
Alkaline Phosphatase: 46 U/L (ref 38–126)
Anion gap: 19 — ABNORMAL HIGH (ref 5–15)
BUN: 16 mg/dL (ref 6–20)
CO2: 17 mmol/L — ABNORMAL LOW (ref 22–32)
Calcium: 8.8 mg/dL — ABNORMAL LOW (ref 8.9–10.3)
Chloride: 104 mmol/L (ref 98–111)
Creatinine, Ser: 1.31 mg/dL — ABNORMAL HIGH (ref 0.61–1.24)
GFR, Estimated: >60 mL/min (ref >60)
Glucose, Bld: 90 mg/dL (ref 70–99)
Potassium: 3.8 mmol/L (ref 3.5–5.1)
Sodium: 140 mmol/L (ref 135–145)
Total Bilirubin: 0.4 mg/dL (ref 0.0–1.2)
Total Protein: 6.6 g/dL (ref 6.5–8.1)

## 2023-05-10 LAB — LIPID PANEL
Cholesterol: 186 mg/dL (ref 0–200)
LDL Cholesterol: UNDETERMINED mg/dL (ref 0–99)
Triglycerides: 1268 mg/dL — ABNORMAL HIGH (ref <150)
VLDL: UNDETERMINED mg/dL (ref 0–40)

## 2023-05-10 LAB — CBC WITH DIFFERENTIAL/PLATELET
Abs Immature Granulocytes: 0.02 10*3/uL (ref 0.00–0.07)
Basophils Absolute: 0 10*3/uL (ref 0.0–0.1)
Basophils Relative: 1 %
Eosinophils Absolute: 0.2 10*3/uL (ref 0.0–0.5)
Eosinophils Relative: 2 %
HCT: 45 % (ref 39.0–52.0)
Hemoglobin: 16.7 g/dL (ref 13.0–17.0)
Immature Granulocytes: 0 %
Lymphocytes Relative: 51 %
Lymphs Abs: 4 10*3/uL (ref 0.7–4.0)
MCH: 34 pg (ref 26.0–34.0)
MCHC: 37.1 g/dL — ABNORMAL HIGH (ref 30.0–36.0)
MCV: 91.6 fL (ref 80.0–100.0)
Monocytes Absolute: 0.5 10*3/uL (ref 0.1–1.0)
Monocytes Relative: 6 %
Neutro Abs: 3.1 10*3/uL (ref 1.7–7.7)
Neutrophils Relative %: 40 %
Platelets: 260 10*3/uL (ref 150–400)
RBC: 4.91 MIL/uL (ref 4.22–5.81)
RDW: 12.5 % (ref 11.5–15.5)
WBC: 7.8 10*3/uL (ref 4.0–10.5)
nRBC: 0 % (ref 0.0–0.2)

## 2023-05-10 LAB — HEMOGLOBIN A1C
Hgb A1c MFr Bld: 5 % (ref 4.8–5.6)
Mean Plasma Glucose: 96.8 mg/dL

## 2023-05-10 LAB — ETHANOL: Alcohol, Ethyl (B): 172 mg/dL — ABNORMAL HIGH (ref <10)

## 2023-05-10 LAB — TSH: TSH: 1.28 u[IU]/mL (ref 0.350–4.500)

## 2023-05-10 LAB — LDL CHOLESTEROL, DIRECT: Direct LDL: 91 mg/dL (ref 0–99)

## 2023-05-10 MED ORDER — LORAZEPAM 1 MG PO TABS: 1.0000 mg | ORAL_TABLET | Freq: Four times a day (QID) | ORAL | Status: DC | PRN

## 2023-05-10 MED ORDER — HALOPERIDOL 5 MG PO TABS: 5.0000 mg | ORAL_TABLET | Freq: Three times a day (TID) | ORAL | Status: DC | PRN

## 2023-05-10 MED ORDER — GABAPENTIN 100 MG PO CAPS: 100.0000 mg | ORAL_CAPSULE | Freq: Two times a day (BID) | ORAL | Status: DC

## 2023-05-10 MED ORDER — THIAMINE MONONITRATE 100 MG PO TABS: 100.0000 mg | ORAL_TABLET | Freq: Every day | ORAL | Status: DC

## 2023-05-10 MED ORDER — DIPHENHYDRAMINE HCL 50 MG PO CAPS: 50.0000 mg | ORAL_CAPSULE | Freq: Three times a day (TID) | ORAL | Status: DC | PRN

## 2023-05-10 MED ORDER — HYDROXYZINE HCL 25 MG PO TABS
25.0000 mg | ORAL_TABLET | Freq: Four times a day (QID) | ORAL | Status: DC | PRN
  Administered 2023-05-10 (×2): 25 mg via ORAL
  Filled 2023-05-10 (×2): qty 1

## 2023-05-10 MED ORDER — MAGNESIUM HYDROXIDE 400 MG/5ML PO SUSP: 30.0000 mL | Freq: Every day | ORAL | Status: DC | PRN

## 2023-05-10 MED ORDER — HALOPERIDOL LACTATE 5 MG/ML IJ SOLN: 10.0000 mg | Freq: Three times a day (TID) | INTRAMUSCULAR | Status: DC | PRN

## 2023-05-10 MED ORDER — DIPHENHYDRAMINE HCL 50 MG/ML IJ SOLN: 50.0000 mg | Freq: Three times a day (TID) | INTRAMUSCULAR | Status: DC | PRN

## 2023-05-10 MED ORDER — ONDANSETRON 4 MG PO TBDP: 4.0000 mg | ORAL_TABLET | Freq: Four times a day (QID) | ORAL | Status: DC | PRN

## 2023-05-10 MED ORDER — LORAZEPAM 2 MG/ML IJ SOLN: 2.0000 mg | Freq: Three times a day (TID) | INTRAMUSCULAR | Status: DC | PRN

## 2023-05-10 MED ORDER — ADULT MULTIVITAMIN W/MINERALS CH
1.0000 | ORAL_TABLET | Freq: Every day | ORAL | Status: DC
  Administered 2023-05-10: 1 via ORAL
  Filled 2023-05-10: qty 1

## 2023-05-10 MED ORDER — THIAMINE HCL 100 MG/ML IJ SOLN
100.0000 mg | Freq: Once | INTRAMUSCULAR | Status: AC
  Administered 2023-05-10: 100 mg via INTRAMUSCULAR
  Filled 2023-05-10: qty 2

## 2023-05-10 MED ORDER — ALUM & MAG HYDROXIDE-SIMETH 200-200-20 MG/5ML PO SUSP: 30.0000 mL | ORAL | Status: DC | PRN

## 2023-05-10 MED ORDER — LOPERAMIDE HCL 2 MG PO CAPS: 2.0000 mg | ORAL_CAPSULE | ORAL | Status: DC | PRN

## 2023-05-10 MED ORDER — ACETAMINOPHEN 325 MG PO TABS: 650.0000 mg | ORAL_TABLET | Freq: Four times a day (QID) | ORAL | Status: DC | PRN

## 2023-05-10 MED ORDER — HALOPERIDOL LACTATE 5 MG/ML IJ SOLN: 5.0000 mg | Freq: Three times a day (TID) | INTRAMUSCULAR | Status: DC | PRN

## 2023-05-10 MED ORDER — GABAPENTIN 100 MG PO CAPS
100.0000 mg | ORAL_CAPSULE | Freq: Two times a day (BID) | ORAL | Status: DC
  Administered 2023-05-10: 100 mg via ORAL
  Filled 2023-05-10: qty 1

## 2023-05-10 MED ORDER — SERTRALINE HCL 25 MG PO TABS
25.0000 mg | ORAL_TABLET | Freq: Every day | ORAL | Status: DC
  Administered 2023-05-10: 25 mg via ORAL
  Filled 2023-05-10: qty 1

## 2023-05-10 MED ORDER — SERTRALINE HCL 25 MG PO TABS: 25.0000 mg | ORAL_TABLET | Freq: Every day | ORAL | Status: DC

## 2023-05-10 MED ORDER — TRAZODONE HCL 50 MG PO TABS: 50.0000 mg | ORAL_TABLET | Freq: Every evening | ORAL | Status: DC | PRN

## 2023-05-10 NOTE — ED Notes (Signed)
 Patient discharged in no acute distress. Before pt arrived to [facility] he stated he wanted to discharge. Patient belongings returned from locker. Patient escorted to front lobby. He will transport himself home.

## 2023-05-10 NOTE — Discharge Instructions (Signed)
 Discharge readmit to the [facility]

## 2023-05-10 NOTE — Discharge Instructions (Signed)

## 2023-05-10 NOTE — ED Notes (Signed)
 Patient is transferring to [facility] at this time. Report provided to Selena Batten, Charity fundraiser. Patient calm and cooperative. No s/s of current distress.

## 2023-05-10 NOTE — ED Provider Notes (Signed)
 FBC/OBS ASAP Discharge Summary  Date and Time: 05/10/2023 2:18 PM  Name: Curtis Ross  MRN:  [id number]   Discharge Diagnoses:  Final diagnoses:  Alcohol use disorder, severe, dependence (HCC)  Major depressive disorder, recurrent episode, moderate (HCC)    Subjective: Patient states "I do not want to lose my job.  I have been to intensive outpatient for chemical dependency before and I would like to do that."  Patient initially agreed to facility based crisis unit admission, briefly admitted but prefers to seek outpatient treatment.  Patient is reassessed by this nurse practitioner face-to-face.  He is seated, no apparent distress.  He is alert and oriented, pleasant and cooperative during assessment.  Patient is appropriately groomed, he presents with euthymic mood, congruent affect.  Curtis Ross prefers outpatient alcohol use disorder treatment as he would like to get back to his job.  He is looking forward to visiting his daughter and girlfriend upon discharge.  Patient states "I have a child to take care of, I need to make sure that I can work while I get the treatment."  Patient denies suicidal and homicidal ideation.  He easily contracts verbally for safety at this time.  He denies history of suicide attempts, denies history of nonsuicidal self-harm behavior.  Patient denies auditory and visual hallucinations.  There is no evidence of delusional thought content no indication that patient is responding to internal stimuli.  Curtis Ross's history includes previous diagnoses of major depressive disorder and generalized anxiety disorder.  He has been followed by outpatient psychiatry at [facility].  Verbalizes current plan to continue to follow with outpatient psychiatry and seek alcohol use disorder treatment.  He reports 1 previous inpatient psychiatric admission, [facility] in 2017.  No family mental health or addiction history reported.  Patient resides in [place] with his  father.  He denies access to weapons.  He is currently employed in [employer].  He reports using alcohol up to 12 or 15 drinks 2 days/week or less.  Patient states "I am a binge drinker, I will go a week or 2 without drinking and then drink again."  He also endorses daily THC use.  Educated regarding illicitly purchased substances, recommended cessation.  He denies substance use aside from alcohol and THC.  Patient endorses average sleep and appetite.  Patient offered support and encouragement.  He declines any person contact for collateral information at this time.   Patient educated and verbalizes understanding of mental health resources and other crisis services in the community.  He is instructed to call 911 and present to the nearest emergency room should he experience any suicidal/homicidal ideation, auditory/visual/hallucinations, or detrimental worsening of mental health condition.    Patient reviewed with attending psychiatrist, Dr. Enedina Finner.  This Clinical research associate initiated referral for CD-IOP program at [facility].   Stay Summary: HPI completed 05/10/2023 0434am:  Curtis Ross is a 33 y/o male who presented voluntarily to [facility] requesting alcohol detox and substance abuse treatment.     Patient was evaluated face-to-face and his chart was reviewed by this nurse practitioner.  On assessment, patient reports struggling with alcohol use. He says he was recently residing at an [facility] but relapsed and has been drinking alcohol daily, consuming approximately 12 cans of "Four Loko" each day. The patient states that he is seeking help and expresses a desire to detox and enter a rehabilitation facility. His last drink was just a few hours ago. He has a history of alcohol  withdrawal seizures but denies any history of delirium tremens (DTs). Patient mentions that several stressors or situations trigger his alcohol use, though he does not elaborate further. In addition to  alcohol use, the patient reports using marijuana intermittently. Blood alcohol level (BAL) is 172, and UDS was positive for marijuana. The patient denies any suicidal ideation, homicidal ideation, hallucinations, self-injurious behaviors, or access to weapons.   Patient is alert and oriented x4.  His speech is coherent, though it is slightly slurred . His mood is reported as anxious. His affect is congruent with his mood, and he is cooperative throughout the interview. Thought process is logical, though there is some mild disorganization, likely due to his intoxicated state. The patient denies experiencing any delusions, hallucinations, or paranoia. There is no evidence of psychosis. His insight into his alcohol use disorder appears to be intact, as he acknowledges his problem and expresses a desire to seek help. Judgment is impaired due to his current alcohol consumption and the ongoing relapse. He denies any current thoughts of harming himself or others.   Total Time spent with patient: 30 minutes  Past Psychiatric History: Major depressive disorder, generalized anxiety disorder, alcohol use disorder Past Medical History: None reported Family History: None reported Family Psychiatric History: None reported Social History: Resides in [place] with his father, currently employed, reports alcohol use disorder Tobacco Cessation:  A prescription for an FDA-approved tobacco cessation medication was offered at discharge and the patient refused  Current Medications:  No current facility-administered medications for this encounter.   Current Outpatient Medications  Medication Sig Dispense Refill   traZODone (DESYREL) 50 MG tablet Take 50 mg by mouth at bedtime as needed for sleep. (Patient not taking: Reported on 05/10/2023)      PTA Medications:  PTA Medications  Medication Sig   traZODone (DESYREL) 50 MG tablet Take 50 mg by mouth at bedtime as needed for sleep. (Patient not taking: Reported on  05/10/2023)   Facility Ordered Medications  Medication   [COMPLETED] thiamine (VITAMIN B1) injection 100 mg       04/23/2022   11:29 AM 01/14/2021    8:19 AM 12/06/2020   10:42 AM  Depression screen PHQ 2/9  Decreased Interest 3 3 0  Down, Depressed, Hopeless 3 1 0  PHQ - 2 Score 6 4 0  Altered sleeping 0 2   Tired, decreased energy 3 2   Change in appetite 0 1   Feeling bad or failure about yourself  3 3   Trouble concentrating 0 2   Moving slowly or fidgety/restless 0 0   Suicidal thoughts 0 0   PHQ-9 Score 12 14   Difficult doing work/chores  Somewhat difficult     Flowsheet Row ED from 05/10/2023 in [facility] Counselor from 01/14/2021 in [facility] ED from 11/10/2020 in [facility] Emergency Department at [facility]  C-SSRS RISK CATEGORY Moderate Risk No Risk No Risk       Musculoskeletal  Strength & Muscle Tone: within normal limits Gait & Station: normal Patient leans: N/A  Psychiatric Specialty Exam  Presentation  General Appearance:  Disheveled  Eye Contact: Fair  Speech: Clear and Coherent; Normal Rate  Speech Volume: Normal  Handedness: Right   Mood and Affect  Mood: Anxious; Irritable  Affect: Congruent   Thought Process  Thought Processes: Coherent  Descriptions of Associations:Intact  Orientation:Full (Time, Place and Person)  Thought Content:Logical  Diagnosis of Schizophrenia or Schizoaffective disorder in  past: No    Hallucinations:Hallucinations: None Description of Auditory Hallucinations: has heard whispers at times, not currently  Ideas of Reference:None  Suicidal Thoughts:Suicidal Thoughts: Yes, Passive SI Passive Intent and/or Plan: Without Intent; Without Plan  Homicidal Thoughts:Homicidal Thoughts: No   Sensorium  Memory: Immediate Good; Recent Good; Remote Good  Judgment: Fair  Insight: Fair   Producer, television/film/video: Fair  Attention Span: Fair  Recall: Good  Fund of Knowledge: Good  Language: Good   Psychomotor Activity  Psychomotor Activity: Psychomotor Activity: Normal   Assets  Assets: Communication Skills; Desire for Improvement; Physical Health; Resilience; Social Support   Sleep  Sleep: Sleep: Fair Number of Hours of Sleep: 4   Nutritional Assessment (For OBS and FBC admissions only) Has the patient had a weight loss or gain of 10 pounds or more in the last 3 months?: No Has the patient had a decrease in food intake/or appetite?: No Does the patient have dental problems?: No Does the patient have eating habits or behaviors that may be indicators of an eating disorder including binging or inducing vomiting?: No Has the patient recently lost weight without trying?: 0 Has the patient been eating poorly because of a decreased appetite?: 0 Malnutrition Screening Tool Score: 0    Physical Exam  Physical Exam Vitals and nursing note reviewed.  Constitutional:      Appearance: Normal appearance. He is well-developed.  HENT:     Head: Normocephalic and atraumatic.     Nose: Nose normal.  Cardiovascular:     Rate and Rhythm: Normal rate.  Pulmonary:     Effort: Pulmonary effort is normal.  Musculoskeletal:        General: Normal range of motion.     Cervical back: Normal range of motion.  Skin:    General: Skin is warm and dry.  Neurological:     Mental Status: He is alert and oriented to person, place, and time.  Psychiatric:        Attention and Perception: Attention and perception normal.        Mood and Affect: Mood and affect normal.        Speech: Speech normal.        Behavior: Behavior normal. Behavior is cooperative.        Thought Content: Thought content normal.        Cognition and Memory: Cognition and memory normal.        Judgment: Judgment normal.    Review of Systems  Constitutional: Negative.   HENT: Negative.    Eyes:  Negative.   Respiratory: Negative.    Cardiovascular: Negative.   Gastrointestinal: Negative.   Genitourinary: Negative.   Musculoskeletal: Negative.   Skin: Negative.   Neurological: Negative.   Psychiatric/Behavioral:  Positive for substance abuse.    There were no vitals taken for this visit. There is no height or weight on file to calculate BMI.  Demographic Factors:  Male and Caucasian  Loss Factors: NA  Historical Factors: NA  Risk Reduction Factors:   Responsible for children under 67 years of age, Sense of responsibility to family, Employed, Living with another person, especially a relative, Positive social support, Positive therapeutic relationship, and Positive coping skills or problem solving skills  Continued Clinical Symptoms:  Alcohol/Substance Abuse/Dependencies  Cognitive Features That Contribute To Risk:  None    Suicide Risk:  Minimal: No identifiable suicidal ideation.  Patients presenting with no risk factors but with morbid ruminations; may be classified as minimal  risk based on the severity of the depressive symptoms  Plan Of Care/Follow-up recommendations:  Patient offered facility based crisis unit admission, he declines.  Prefers CD-IOP. Recommend follow-up with outpatient psychiatry, resources provided.  Medication: -Trazodone 50 mg nightly as needed/sleep  Disposition: Discharge  Lenard Lance, FNP 05/10/2023, 2:18 PM

## 2023-05-10 NOTE — ED Notes (Signed)
 Patient currently asleep. Breathing even and unlabored with even rise and fall of chest. No s/s of current distress.

## 2023-05-10 NOTE — ED Provider Notes (Signed)
 FBC/OBS ASAP Discharge Summary  Date and Time: 05/10/2023 9:26 AM  Name: Curtis Ross  MRN:  [id number]   Discharge Diagnoses:  Final diagnoses:  Alcohol use disorder  Severe episode of recurrent major depressive disorder, without psychotic features (HCC)   HPI on admission Curtis Ross: Curtis Ross is a 33 y/o male who presented voluntarily to [facility] requesting alcohol detox and substance abuse treatment.     Patient was evaluated face-to-face and his chart was reviewed by this nurse practitioner.  On assessment, patient reports struggling with alcohol use. He says he was recently residing at an [facility] but relapsed and has been drinking alcohol daily, consuming approximately 12 cans of "Four Loko" each day. The patient states that he is seeking help and expresses a desire to detox and enter a rehabilitation facility. His last drink was just a few hours ago. He has a history of alcohol withdrawal seizures but denies any history of delirium tremens (DTs). Patient mentions that several stressors or situations trigger his alcohol use, though he does not elaborate further. In addition to alcohol use, the patient reports using marijuana intermittently. Blood alcohol level (BAL) is 172, and UDS was positive for marijuana. The patient denies any suicidal ideation, homicidal ideation, hallucinations, self-injurious behaviors, or access to weapons.  Patient recommended for treatment in the [facility].   Patient seen face-to-face by this provider, chart reviewed, and case consulted with Dr. Enedina Finner on 05/10/2023.  Per chart review patient has had services in the past with [facility] on second floor.  He currently has no outpatient services in place.  He has previous suicide attempt with inpatient psychiatric admission.  He is currently on probation for assault on public official.  Subjective:   Currently on assessment patient is observed lying in his bed asleep.  He is easily awakened.  He is little  irritable upon approach.  He is alert/oriented x 4, cooperative, and fairly attentive.  His speech is clear and coherent. He answers questions with quick "yes or no" answers.  He endorses depression but does not elaborate.  He has a dysphoric affect.  He is currently denying any suicidal ideations but admits to feeling suicidal upon admission.  He denies any intent, plan or access to firearms/weapons..  He denies homicidal ideations.  He denies auditory/visual hallucinations.  However he does admit that at times he does hear a faint whispers at times, and he cannot make out what the whispers are saying.  He is currently denying any AVH.  He does not appear to be responding to internal/external stimuli.  He is currently denying any alcohol withdrawal symptoms.  He has taken Neurontin in the past and reports it helped his anxiety.  Will restart medication.  Stay Summary:   Patient meets criteria for treatment in the facility based crisis unit.   He is interested in residential substance abuse treatment upon discharge. However, due to his employment he is unsure if he will be able to participate in program.  Total Time spent with patient: 20 minutes  Past Psychiatric History: see H&P Past Medical History: see H&P Family History: see H&P Family Psychiatric History: see H&P Social History: see H&P Tobacco Cessation:  N/A, patient does not currently use tobacco products  Current Medications:  Current Facility-Administered Medications  Medication Dose Route Frequency Provider Last Rate Last Admin   acetaminophen (TYLENOL) tablet 650 mg  650 mg Oral Q6H PRN Ross, Curtis A, NP       alum & mag  hydroxide-simeth (MAALOX/MYLANTA) 200-200-20 MG/5ML suspension 30 mL  30 mL Oral Q4H PRN Ross, Curtis A, NP       haloperidol (HALDOL) tablet 5 mg  5 mg Oral TID PRN Ross, Curtis A, NP       And   diphenhydrAMINE (BENADRYL) capsule 50 mg  50 mg Oral TID PRN Ross, Curtis A, NP       haloperidol lactate (HALDOL)  injection 5 mg  5 mg Intramuscular TID PRN Ross, Curtis A, NP       And   diphenhydrAMINE (BENADRYL) injection 50 mg  50 mg Intramuscular TID PRN Ross, Curtis A, NP       And   LORazepam (ATIVAN) injection 2 mg  2 mg Intramuscular TID PRN Ross, Curtis A, NP       haloperidol lactate (HALDOL) injection 10 mg  10 mg Intramuscular TID PRN Ross, Curtis A, NP       And   diphenhydrAMINE (BENADRYL) injection 50 mg  50 mg Intramuscular TID PRN Ross, Curtis A, NP       And   LORazepam (ATIVAN) injection 2 mg  2 mg Intramuscular TID PRN Ross, Curtis A, NP       gabapentin (NEURONTIN) capsule 100 mg  100 mg Oral BID Ardis Hughs, NP       hydrOXYzine (ATARAX) tablet 25 mg  25 mg Oral Q6H PRN Ross, Curtis A, NP   25 mg at 05/10/23 0932   loperamide (IMODIUM) capsule 2-4 mg  2-4 mg Oral PRN Ross, Curtis A, NP       LORazepam (ATIVAN) tablet 1 mg  1 mg Oral Q6H PRN Ross, Curtis A, NP       magnesium hydroxide (MILK OF MAGNESIA) suspension 30 mL  30 mL Oral Daily PRN Ross, Curtis A, NP       multivitamin with minerals tablet 1 tablet  1 tablet Oral Daily Ross, Curtis A, NP       ondansetron (ZOFRAN-ODT) disintegrating tablet 4 mg  4 mg Oral Q6H PRN Ross, Curtis A, NP       sertraline (ZOLOFT) tablet 25 mg  25 mg Oral Daily Ross, Curtis A, NP       [START ON 05/11/2023] thiamine (VITAMIN B1) tablet 100 mg  100 mg Oral Daily Ross, Curtis A, NP       traZODone (DESYREL) tablet 50 mg  50 mg Oral QHS PRN Ross, Curtis A, NP       Current Outpatient Medications  Medication Sig Dispense Refill   clonazePAM (KLONOPIN) 0.5 MG tablet Take 0.5 mg by mouth 3 (three) times daily as needed.     gabapentin (NEURONTIN) 600 MG tablet Take 600 mg by mouth 3 (three) times daily.     ibuprofen (ADVIL,MOTRIN) 200 MG tablet Take 800 mg by mouth every 6 (six) hours as needed for moderate pain. (Patient not taking: Reported on 04/23/2022)      PTA Medications:  Facility Ordered Medications  Medication   acetaminophen  (TYLENOL) tablet 650 mg   alum & mag hydroxide-simeth (MAALOX/MYLANTA) 200-200-20 MG/5ML suspension 30 mL   magnesium hydroxide (MILK OF MAGNESIA) suspension 30 mL   haloperidol (HALDOL) tablet 5 mg   And   diphenhydrAMINE (BENADRYL) capsule 50 mg   haloperidol lactate (HALDOL) injection 5 mg   And   diphenhydrAMINE (BENADRYL) injection 50 mg   And   LORazepam (ATIVAN) injection 2 mg   haloperidol lactate (HALDOL) injection 10 mg   And  diphenhydrAMINE (BENADRYL) injection 50 mg   And   LORazepam (ATIVAN) injection 2 mg   traZODone (DESYREL) tablet 50 mg   [COMPLETED] thiamine (VITAMIN B1) injection 100 mg   [START ON 05/11/2023] thiamine (VITAMIN B1) tablet 100 mg   multivitamin with minerals tablet 1 tablet   LORazepam (ATIVAN) tablet 1 mg   hydrOXYzine (ATARAX) tablet 25 mg   loperamide (IMODIUM) capsule 2-4 mg   ondansetron (ZOFRAN-ODT) disintegrating tablet 4 mg   sertraline (ZOLOFT) tablet 25 mg   gabapentin (NEURONTIN) capsule 100 mg   PTA Medications  Medication Sig   ibuprofen (ADVIL,MOTRIN) 200 MG tablet Take 800 mg by mouth every 6 (six) hours as needed for moderate pain. (Patient not taking: Reported on 04/23/2022)   clonazePAM (KLONOPIN) 0.5 MG tablet Take 0.5 mg by mouth 3 (three) times daily as needed.   gabapentin (NEURONTIN) 600 MG tablet Take 600 mg by mouth 3 (three) times daily.       04/23/2022   11:29 AM 01/14/2021    8:19 AM 12/06/2020   10:42 AM  Depression screen PHQ 2/9  Decreased Interest 3 3 0  Down, Depressed, Hopeless 3 1 0  PHQ - 2 Score 6 4 0  Altered sleeping 0 2   Tired, decreased energy 3 2   Change in appetite 0 1   Feeling bad or failure about yourself  3 3   Trouble concentrating 0 2   Moving slowly or fidgety/restless 0 0   Suicidal thoughts 0 0   PHQ-9 Score 12 14   Difficult doing work/chores  Somewhat difficult     Flowsheet Row ED from 05/10/2023 in [facility] Counselor from 01/14/2021 in  [facility] ED from 11/10/2020 in [place] [facility] Department at [facility]  C-SSRS RISK CATEGORY Moderate Risk No Risk No Risk       Musculoskeletal  Strength & Muscle Tone: within normal limits Gait & Station: normal Patient leans: N/A  Psychiatric Specialty Exam  Presentation  General Appearance:  Disheveled  Eye Contact: Fair  Speech: Clear and Coherent; Normal Rate  Speech Volume: Normal  Handedness: Right   Mood and Affect  Mood: Anxious; Irritable  Affect: Congruent   Thought Process  Thought Processes: Coherent  Descriptions of Associations:Intact  Orientation:Full (Time, Place and Person)  Thought Content:Logical  Diagnosis of Schizophrenia or Schizoaffective disorder in past: No    Hallucinations:Hallucinations: None Description of Auditory Hallucinations: has heard whispers at times, not currently  Ideas of Reference:None  Suicidal Thoughts:Suicidal Thoughts: Yes, Passive SI Passive Intent and/or Plan: Without Intent; Without Plan  Homicidal Thoughts:Homicidal Thoughts: No   Sensorium  Memory: Immediate Good; Recent Good; Remote Good  Judgment: Fair  Insight: Fair   Chartered certified accountant: Fair  Attention Span: Fair  Recall: Good  Fund of Knowledge: Good  Language: Good   Psychomotor Activity  Psychomotor Activity: Psychomotor Activity: Normal   Assets  Assets: Communication Skills; Desire for Improvement; Physical Health; Resilience; Social Support   Sleep  Sleep: Sleep: Fair Number of Hours of Sleep: 4   Nutritional Assessment (For OBS and FBC admissions only) Has the patient had a weight loss or gain of 10 pounds or more in the last 3 months?: No Has the patient had a decrease in food intake/or appetite?: No Does the patient have dental problems?: No Does the patient have eating habits or behaviors that may be indicators of an eating disorder  including binging or inducing  vomiting?: No Has the patient recently lost weight without trying?: 0 Has the patient been eating poorly because of a decreased appetite?: 0 Malnutrition Screening Tool Score: 0    Physical Exam  Physical Exam Constitutional:      Appearance: Normal appearance.  Eyes:     General:        Right eye: No discharge.        Left eye: No discharge.  Cardiovascular:     Rate and Rhythm: Normal rate.  Pulmonary:     Effort: No respiratory distress.  Musculoskeletal:     Cervical back: Normal range of motion.  Skin:    Coloration: Skin is not jaundiced or pale.  Neurological:     Mental Status: He is alert and oriented to person, place, and time.  Psychiatric:        Attention and Perception: Attention and perception normal.        Mood and Affect: Mood is anxious and depressed.        Speech: Speech normal.        Behavior: Behavior is agitated.        Thought Content: Thought content normal.        Cognition and Memory: Cognition normal.        Judgment: Judgment is impulsive.    Review of Systems  Constitutional:  Negative for chills and fever.  HENT:  Negative for hearing loss.   Respiratory:  Negative for cough and shortness of breath.   Cardiovascular:  Negative for chest pain.  Musculoskeletal: Negative.   Neurological:  Negative for tremors and seizures.  Psychiatric/Behavioral:  Positive for depression and substance abuse. The patient is nervous/anxious.    Blood pressure 112/71, pulse 94, temperature 98.5 F (36.9 C), temperature source Oral, resp. rate 16, SpO2 96%. There is no height or weight on file to calculate BMI.   Disposition:   Patient recommended for treatment in the [facility]  Continue CIWA protocol -lorazepam 1 mg every 6 hours prn for CIWA >10 -thiamine 100 mg daily for nutritional supplementation -hydroxyzine 25 mg every 6 hours prn for anxiety, CIWA < or = 10 -ondansetron 4 mg ODT every 6 hours prn  nausea/vomiting -loperamide 2-4 mg capsule prn diarrhea or loose stools -Zoloft 25 mg daily  Start-Gabapentin 100 mg TID    Ardis Hughs, NP 05/10/2023, 9:26 AM
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
## 2023-05-10 NOTE — ED Provider Notes (Signed)
 [facility] Continuous Assessment Admission H&P  Date: 05/10/23 Patient Name: Curtis Ross MRN: [id number] Chief Complaint: alcohol detox  Diagnoses:  Final diagnoses:  Alcohol use disorder  Severe episode of recurrent major depressive disorder, without psychotic features [facility])    HPI: STRAN RAPER is a 33 y/o male who presented voluntarily to [facility] requesting alcohol detox and substance abuse treatment.    Patient was evaluated face-to-face and his chart was reviewed by this nurse practitioner.  On assessment, patient reports struggling with alcohol use. He says he was recently residing at an [facility] but relapsed and has been drinking alcohol daily, consuming approximately 12 cans of "Four Loko" each day. The patient states that he is seeking help and expresses a desire to detox and enter a rehabilitation facility. His last drink was just a few hours ago. He has a history of alcohol withdrawal seizures but denies any history of delirium tremens (DTs). Patient mentions that several stressors or situations trigger his alcohol use, though he does not elaborate further. In addition to alcohol use, the patient reports using marijuana intermittently. Blood alcohol level (BAL) is 172, and UDS was positive for marijuana. The patient denies any suicidal ideation, homicidal ideation, hallucinations, self-injurious behaviors, or access to weapons.  Patient is alert and oriented x4.  His speech is coherent, though it is slightly slurred . His mood is reported as anxious. His affect is congruent with his mood, and he is cooperative throughout the interview. Thought process is logical, though there is some mild disorganization, likely due to his intoxicated state. The patient denies experiencing any delusions, hallucinations, or paranoia. There is no evidence of psychosis. His insight into his alcohol use disorder appears to be intact, as he acknowledges his problem and expresses a desire  to seek help. Judgment is impaired due to his current alcohol consumption and the ongoing relapse. He denies any current thoughts of harming himself or others.   Total Time spent with patient: 30 minutes  Musculoskeletal  Strength & Muscle Tone: within normal limits Gait & Station: normal Patient leans: Right  Psychiatric Specialty Exam  Presentation General Appearance:  Disheveled  Eye Contact: Fair  Speech: Clear and Coherent  Speech Volume: Normal  Handedness: Right   Mood and Affect  Mood: Irritable  Affect: Congruent   Thought Process  Thought Processes: Coherent  Descriptions of Associations:Intact  Orientation:Full (Time, Place and Person)  Thought Content:WDL  Diagnosis of Schizophrenia or Schizoaffective disorder in past: No   Hallucinations:Hallucinations: Auditory Description of Auditory Hallucinations: "faint sound like whispers"  Ideas of Reference:None  Suicidal Thoughts:Suicidal Thoughts: Yes, Passive SI Passive Intent and/or Plan: Without Plan; Without Intent  Homicidal Thoughts:Homicidal Thoughts: No   Sensorium  Memory: Immediate Good; Recent Good; Remote Good  Judgment: Fair  Insight: Fair   Art therapist  Concentration: Good  Attention Span: Fair  Recall: Good  Fund of Knowledge: Good  Language: Good   Psychomotor Activity  Psychomotor Activity: Psychomotor Activity: Normal   Assets  Assets: Communication Skills; Desire for Improvement; Physical Health   Sleep  Sleep: Sleep: Poor Number of Hours of Sleep: 4   Nutritional Assessment (For OBS and FBC admissions only) Has the patient had a weight loss or gain of 10 pounds or more in the last 3 months?: No Has the patient had a decrease in food intake/or appetite?: No Does the patient have dental problems?: No Does the patient have eating habits or behaviors that may be indicators of an eating  disorder including binging or inducing  vomiting?: No Has the patient recently lost weight without trying?: 0 Has the patient been eating poorly because of a decreased appetite?: 0 Malnutrition Screening Tool Score: 0    Physical Exam Vitals and nursing note reviewed.  Constitutional:      General: He is not in acute distress.    Appearance: He is well-developed.  HENT:     Head: Normocephalic and atraumatic.  Eyes:     Conjunctiva/sclera: Conjunctivae normal.  Cardiovascular:     Rate and Rhythm: Normal rate.  Pulmonary:     Effort: Pulmonary effort is normal.  Abdominal:     Palpations: Abdomen is soft.  Musculoskeletal:        General: Normal range of motion.     Cervical back: Normal range of motion.  Skin:    General: Skin is warm and dry.     Capillary Refill: Capillary refill takes less than 2 seconds.  Neurological:     Mental Status: He is alert and oriented to person, place, and time.  Psychiatric:        Mood and Affect: Mood normal.    ROS  Blood pressure 112/71, pulse 94, temperature 98.5 F (36.9 C), temperature source Oral, resp. rate 16, SpO2 96%. There is no height or weight on file to calculate BMI.  Past Psychiatric History: Alcohol abuse, GAD, MDD  Is the patient at risk to self? No  Has the patient been a risk to self in the past 6 months? No .    Has the patient been a risk to self within the distant past? No   Is the patient a risk to others? No   Has the patient been a risk to others in the past 6 months? No   Has the patient been a risk to others within the distant past? No   Past Medical History:  Alcohol abuse, GAD, MDD  Family History:  Family History  Problem Relation Age of Onset   Alcoholism Paternal Grandmother      Social History:  Social History   Tobacco Use   Smoking status: Every Day    Current packs/day: 0.50    Types: Cigarettes   Smokeless tobacco: Never  Vaping Use   Vaping status: Never Used  Substance Use Topics   Alcohol use: Not Currently     Comment: Pt Sober since Oct 20th   Drug use: Not Currently     Last Labs:  Admission on 05/10/2023  Component Date Value Ref Range Status   WBC 05/10/2023 7.8  4.0 - 10.5 K/uL Final   RBC 05/10/2023 4.91  4.22 - 5.81 MIL/uL Final   Hemoglobin 05/10/2023 16.7  13.0 - 17.0 g/dL Final   HCT 91/47/8295 45.0  39.0 - 52.0 % Final   MCV 05/10/2023 91.6  80.0 - 100.0 fL Final   MCH 05/10/2023 34.0  26.0 - 34.0 pg Final   MCHC 05/10/2023 37.1 (H)  30.0 - 36.0 g/dL Final   RDW 62/13/0865 12.5  11.5 - 15.5 % Final   Platelets 05/10/2023 260  150 - 400 K/uL Final   nRBC 05/10/2023 0.0  0.0 - 0.2 % Final   Neutrophils Relative % 05/10/2023 40  % Final   Neutro Abs 05/10/2023 3.1  1.7 - 7.7 K/uL Final   Lymphocytes Relative 05/10/2023 51  % Final   Lymphs Abs 05/10/2023 4.0  0.7 - 4.0 K/uL Final   Monocytes Relative 05/10/2023 6  % Final  Monocytes Absolute 05/10/2023 0.5  0.1 - 1.0 K/uL Final   Eosinophils Relative 05/10/2023 2  % Final   Eosinophils Absolute 05/10/2023 0.2  0.0 - 0.5 K/uL Final   Basophils Relative 05/10/2023 1  % Final   Basophils Absolute 05/10/2023 0.0  0.0 - 0.1 K/uL Final   Immature Granulocytes 05/10/2023 0  % Final   Abs Immature Granulocytes 05/10/2023 0.02  0.00 - 0.07 K/uL Final   Performed at [facility], [address], [place], [place] [address]   Sodium 05/10/2023 140  135 - 145 mmol/L Final   POST-ULTRACENTRIFUGATION   Potassium 05/10/2023 3.8  3.5 - 5.1 mmol/L Final   POST-ULTRACENTRIFUGATION   Chloride 05/10/2023 104  98 - 111 mmol/L Final   POST-ULTRACENTRIFUGATION   CO2 05/10/2023 17 (L)  22 - 32 mmol/L Final   POST-ULTRACENTRIFUGATION   Glucose, Bld 05/10/2023 90  70 - 99 mg/dL Final   Comment: Glucose reference range applies only to samples taken after fasting for at least 8 hours. POST-ULTRACENTRIFUGATION    BUN 05/10/2023 16  6 - 20 mg/dL Final   POST-ULTRACENTRIFUGATION   Creatinine, Ser 05/10/2023 1.31 (H)  0.61 - 1.24 mg/dL Final    POST-ULTRACENTRIFUGATION   Calcium 05/10/2023 8.8 (L)  8.9 - 10.3 mg/dL Final   POST-ULTRACENTRIFUGATION   Total Protein 05/10/2023 6.6  6.5 - 8.1 g/dL Final   POST-ULTRACENTRIFUGATION   Albumin 05/10/2023 3.9  3.5 - 5.0 g/dL Final   POST-ULTRACENTRIFUGATION   AST 05/10/2023 22  15 - 41 U/L Final   POST-ULTRACENTRIFUGATION   ALT 05/10/2023 26  0 - 44 U/L Final   POST-ULTRACENTRIFUGATION   Alkaline Phosphatase 05/10/2023 46  38 - 126 U/L Final   POST-ULTRACENTRIFUGATION   Total Bilirubin 05/10/2023 0.4  0.0 - 1.2 mg/dL Final   POST-ULTRACENTRIFUGATION   GFR, Estimated 05/10/2023 >60  >60 mL/min Final   Comment: POST-ULTRACENTRIFUGATION (NOTE) Calculated using the CKD-EPI Creatinine Equation (2021)    Anion gap 05/10/2023 19 (H)  5 - 15 Final   Comment: POST-ULTRACENTRIFUGATION Performed at [facility], [address], [place], [place] [address]    Hgb A1c MFr Bld 05/10/2023 5.0  4.8 - 5.6 % Final   Comment: (NOTE) Pre diabetes:          5.7%-6.4%  Diabetes:              >6.4%  Glycemic control for   <7.0% adults with diabetes    Mean Plasma Glucose 05/10/2023 96.8  mg/dL Final   Performed at [facility], [address] [place], [place] [address]   Alcohol, Ethyl (B) 05/10/2023 172 (H)  <10 mg/dL Final   Comment: (NOTE) Lowest detectable limit for serum alcohol is 10 mg/dL.  For medical purposes only. Performed at [facility], [address] [place], [place] [address]    Cholesterol 05/10/2023 186  0 - 200 mg/dL Final   Triglycerides 27/25/3664 1,268 (H)  <150 mg/dL Final   RESULT CONFIRMED BY MANUAL DILUTION   HDL 05/10/2023 NOT REPORTED DUE TO HIGH TRIGLYCERIDES  >40 mg/dL Final   Total CHOL/HDL Ratio 05/10/2023 NOT REPORTED DUE TO HIGH TRIGLYCERIDES  RATIO Final   VLDL 05/10/2023 UNABLE TO CALCULATE IF TRIGLYCERIDE OVER 400 mg/dL  0 - 40 mg/dL Final   LDL Cholesterol 05/10/2023 UNABLE TO CALCULATE IF TRIGLYCERIDE OVER 400 mg/dL  0 - 99 mg/dL  Final   Comment:        Total Cholesterol/HDL:CHD Risk Coronary Heart  Disease Risk Table                     Men   Women  1/2 Average Risk   3.4   3.3  Average Risk       5.0   4.4  2 X Average Risk   9.6   7.1  3 X Average Risk  23.4   11.0        Use the calculated Patient Ratio above and the CHD Risk Table to determine the patient's CHD Risk.        ATP III CLASSIFICATION (LDL):  <100     mg/dL   Optimal  981-191  mg/dL   Near or Above                    Optimal  130-159  mg/dL   Borderline  478-295  mg/dL   High  >621     mg/dL   Very High Performed at [facility], [address], [place], [place] [address]    TSH 05/10/2023 1.280  0.350 - 4.500 uIU/mL Final   Comment: Performed by a 3rd Generation assay with a functional sensitivity of <=0.01 uIU/mL. Performed at [facility], [address], [place], [place] [address]    Opiates 05/10/2023 NONE DETECTED  NONE DETECTED Final   Cocaine 05/10/2023 NONE DETECTED  NONE DETECTED Final   Benzodiazepines 05/10/2023 NONE DETECTED  NONE DETECTED Final   Amphetamines 05/10/2023 NONE DETECTED  NONE DETECTED Final   Tetrahydrocannabinol 05/10/2023 POSITIVE (A)  NONE DETECTED Final   Barbiturates 05/10/2023 NONE DETECTED  NONE DETECTED Final   Comment: (NOTE) DRUG SCREEN FOR MEDICAL PURPOSES ONLY.  IF CONFIRMATION IS NEEDED FOR ANY PURPOSE, NOTIFY LAB WITHIN 5 DAYS.  LOWEST DETECTABLE LIMITS FOR URINE DRUG SCREEN Drug Class                     Cutoff (ng/mL) Amphetamine and metabolites    1000 Barbiturate and metabolites    200 Benzodiazepine                 200 Opiates and metabolites        300 Cocaine and metabolites        300 THC                            50 Performed at [facility], [address], [place], [place] [address]     Allergies: Patient has no known allergies.  Medications:  Facility Ordered Medications  Medication   acetaminophen (TYLENOL) tablet 650 mg   alum & mag  hydroxide-simeth (MAALOX/MYLANTA) 200-200-20 MG/5ML suspension 30 mL   magnesium hydroxide (MILK OF MAGNESIA) suspension 30 mL   haloperidol (HALDOL) tablet 5 mg   And   diphenhydrAMINE (BENADRYL) capsule 50 mg   haloperidol lactate (HALDOL) injection 5 mg   And   diphenhydrAMINE (BENADRYL) injection 50 mg   And   LORazepam (ATIVAN) injection 2 mg   haloperidol lactate (HALDOL) injection 10 mg   And   diphenhydrAMINE (BENADRYL) injection 50 mg   And   LORazepam (ATIVAN) injection 2 mg   traZODone (DESYREL) tablet 50 mg   [COMPLETED] thiamine (VITAMIN B1) injection 100 mg   [START ON 05/11/2023] thiamine (VITAMIN B1) tablet 100 mg   multivitamin with minerals tablet 1 tablet   LORazepam (ATIVAN) tablet 1 mg  hydrOXYzine (ATARAX) tablet 25 mg   loperamide (IMODIUM) capsule 2-4 mg   ondansetron (ZOFRAN-ODT) disintegrating tablet 4 mg   PTA Medications  Medication Sig   ibuprofen (ADVIL,MOTRIN) 200 MG tablet Take 800 mg by mouth every 6 (six) hours as needed for moderate pain. (Patient not taking: Reported on 04/23/2022)   clonazePAM (KLONOPIN) 0.5 MG tablet Take 0.5 mg by mouth 3 (three) times daily as needed.   gabapentin (NEURONTIN) 600 MG tablet Take 600 mg by mouth 3 (three) times daily.    Screenings    Flowsheet Row Most Recent Value  CIWA-Ar Total 3       Medical Decision Making  Patient is recommended for admission to [facility] for detox, stabilization, and substance abuse treatment.  Patient will be admitted to [facility] pending bed availability at [facility].  Initiate CIWA protocol -lorazepam 1 mg every 6 hours prn for CIWA >10 -thiamine 100 mg daily for nutritional supplementation -hydroxyzine 25 mg every 6 hours prn for anxiety, CIWA < or = 10 -ondansetron 4 mg ODT every 6 hours prn nausea/vomiting -loperamide 2-4 mg capsule prn diarrhea or loose stools     Recommendations  Based on my evaluation the patient does not appear to have an emergency medical condition.  Maricela Bo, NP 05/10/23  6:51 AM

## 2023-05-10 NOTE — ED Notes (Signed)
 Patient admitted to [facility] for SI and hearing voices. On admission patient stated to have continuous thoughts for harming self but don't have a plan at the moment.Denies HI. Calm and composed on the bed. Due meds given. Pt contracts for safety and will monitor closely.
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
## 2023-05-11 ENCOUNTER — Telehealth (HOSPITAL_COMMUNITY): Payer: Self-pay | Admitting: Licensed Clinical Social Worker

## 2023-05-11 NOTE — Telephone Encounter (Signed)
 The therapist attempts to reach [facility] at the request of the [facility] regarding SA IOP leaving a HIPAA-compliant voicemail with this therapist's direct contact number.  Myrna Blazer, MA, LCSW, [facility], LCAS 05/11/2023

## 2023-05-25 ENCOUNTER — Ambulatory Visit (HOSPITAL_COMMUNITY)
Admission: EM | Admit: 2023-05-25 | Discharge: 2023-05-25 | Payer: MEDICAID | Attending: Licensed Clinical Social Worker | Admitting: Licensed Clinical Social Worker

## 2023-05-25 ENCOUNTER — Other Ambulatory Visit (HOSPITAL_COMMUNITY)
Admission: EM | Admit: 2023-05-25 | Discharge: 2023-05-28 | Disposition: A | Discharge status: Home / Self Care | Payer: MEDICAID | Attending: Psychiatry | Admitting: Psychiatry

## 2023-05-25 DIAGNOSIS — F339 Major depressive disorder, recurrent, unspecified: Secondary | ICD-10-CM | POA: Insufficient documentation

## 2023-05-25 DIAGNOSIS — Z91148 Patient's other noncompliance with medication regimen for other reason: Secondary | ICD-10-CM | POA: Diagnosis not present

## 2023-05-25 DIAGNOSIS — F1010 Alcohol abuse, uncomplicated: Secondary | ICD-10-CM | POA: Insufficient documentation

## 2023-05-25 DIAGNOSIS — Z5900 Homelessness unspecified: Secondary | ICD-10-CM | POA: Diagnosis not present

## 2023-05-25 LAB — COMPREHENSIVE METABOLIC PANEL WITH GFR
ALT: 31 U/L (ref 0–44)
AST: 25 U/L (ref 15–41)
Albumin: 4.4 g/dL (ref 3.5–5.0)
Alkaline Phosphatase: 56 U/L (ref 38–126)
Anion gap: 12 (ref 5–15)
BUN: 21 mg/dL — ABNORMAL HIGH (ref 6–20)
CO2: 23 mmol/L (ref 22–32)
Calcium: 9.4 mg/dL (ref 8.9–10.3)
Chloride: 100 mmol/L (ref 98–111)
Creatinine, Ser: 1.23 mg/dL (ref 0.61–1.24)
GFR, Estimated: >60 mL/min (ref >60)
Glucose, Bld: 88 mg/dL (ref 70–99)
Potassium: 4.1 mmol/L (ref 3.5–5.1)
Sodium: 135 mmol/L (ref 135–145)
Total Bilirubin: 1.2 mg/dL (ref 0.0–1.2)
Total Protein: 7.2 g/dL (ref 6.5–8.1)

## 2023-05-25 LAB — TSH: TSH: 2.491 u[IU]/mL (ref 0.350–4.500)

## 2023-05-25 LAB — CBC WITH DIFFERENTIAL/PLATELET
Abs Immature Granulocytes: 0.01 10*3/uL (ref 0.00–0.07)
Basophils Absolute: 0.1 10*3/uL (ref 0.0–0.1)
Basophils Relative: 1 %
Eosinophils Absolute: 0.1 10*3/uL (ref 0.0–0.5)
Eosinophils Relative: 1 %
HCT: 45.5 % (ref 39.0–52.0)
Hemoglobin: 16.1 g/dL (ref 13.0–17.0)
Immature Granulocytes: 0 %
Lymphocytes Relative: 39 %
Lymphs Abs: 3.1 10*3/uL (ref 0.7–4.0)
MCH: 32.2 pg (ref 26.0–34.0)
MCHC: 35.4 g/dL (ref 30.0–36.0)
MCV: 91 fL (ref 80.0–100.0)
Monocytes Absolute: 0.8 10*3/uL (ref 0.1–1.0)
Monocytes Relative: 10 %
Neutro Abs: 3.9 10*3/uL (ref 1.7–7.7)
Neutrophils Relative %: 49 %
Platelets: 223 10*3/uL (ref 150–400)
RBC: 5 MIL/uL (ref 4.22–5.81)
RDW: 12.1 % (ref 11.5–15.5)
WBC: 8 10*3/uL (ref 4.0–10.5)
nRBC: 0 % (ref 0.0–0.2)

## 2023-05-25 LAB — POCT URINE DRUG SCREEN - MANUAL ENTRY (I-SCREEN)
POC Amphetamine UR: NOT DETECTED
POC Buprenorphine (BUP): NOT DETECTED
POC Cocaine UR: NOT DETECTED
POC Marijuana UR: POSITIVE — AB
POC Methadone UR: NOT DETECTED
POC Methamphetamine UR: NOT DETECTED
POC Morphine: NOT DETECTED
POC Oxazepam (BZO): NOT DETECTED
POC Oxycodone UR: NOT DETECTED
POC Secobarbital (BAR): NOT DETECTED

## 2023-05-25 LAB — ETHANOL: Alcohol, Ethyl (B): <10 mg/dL (ref <10)

## 2023-05-25 MED ORDER — ACETAMINOPHEN 325 MG PO TABS: 650.0000 mg | ORAL_TABLET | Freq: Four times a day (QID) | ORAL | Status: DC | PRN

## 2023-05-25 MED ORDER — ALUM & MAG HYDROXIDE-SIMETH 200-200-20 MG/5ML PO SUSP: 30.0000 mL | ORAL | Status: DC | PRN

## 2023-05-25 MED ORDER — MAGNESIUM HYDROXIDE 400 MG/5ML PO SUSP: 30.0000 mL | Freq: Every day | ORAL | Status: DC | PRN

## 2023-05-25 MED ORDER — OLANZAPINE 10 MG IM SOLR: 5.0000 mg | Freq: Three times a day (TID) | INTRAMUSCULAR | Status: DC | PRN

## 2023-05-25 MED ORDER — OLANZAPINE 5 MG PO TBDP: 5.0000 mg | ORAL_TABLET | Freq: Three times a day (TID) | ORAL | Status: DC | PRN

## 2023-05-25 MED ORDER — OLANZAPINE 10 MG IM SOLR: 10.0000 mg | Freq: Three times a day (TID) | INTRAMUSCULAR | Status: DC | PRN

## 2023-05-25 NOTE — ED Provider Notes (Signed)
 Facility Based Crisis Admission H&P  Date: 05/26/23 Patient Name: Curtis Ross MRN: [id number] Chief Complaint: alcohol abuse  Diagnoses:  Final diagnoses:  Alcohol abuse  Homelessness unspecified  Recurrent major depressive disorder, remission status unspecified (HCC)  H/O medication noncompliance    HPI:  Curtis Ross, 33 y/o male with a history of alcohol abuse, MDD, and substance abuse.  Presented to [facility] voluntarily.  Per the patient he has been struggling with alcohol, depression and he has not been taking his medicine for a few weeks.  Per the patient he is currently not seeing a psychiatrist or therapist.  I review of patient records show the patient was recently admitted to [facility] 2 weeks ago however when patient was discharged patient did not follow up with none of the appointments given to him.  Patient also reports he is currently homeless.  According to the patient he also has been struggling with cocaine abuse.  Reports he has been drinking 15 bottles of alcohol daily. Patient is currently unemployed.  Denies currently want to hurt himself or others.  Face-to-face evaluation of patient, patient is alert and oriented x 4, speech is clear, maintained minimal eye contact.  Patient denies current SI, HI, AVH or paranoia.  But stating that he does have those periodically but he is not having them tonight.  Patient reports he has been drinking a lot of alcohol which he stated he consumed 15 bottles of beer daily.  Patient also report he used cocaine last use was last week.  Patient reported that he has been struggling with depression because of his homelessness and he has a 2 month or child and he was staying with his baby mamma but he can stay there no more.  Patient denies access to gun denies wanting to hurt himself or others.  Patient stated he is looking for inpatient detox program.    PHQ9 completed pt score 24  Recommend FBC  PHQ 2-9:  Flowsheet Row ED from  05/25/2023 in [facility] Video Visit from 04/23/2022 in [facility] from 01/14/2021 in [facility]  Thoughts that you would be better off dead, or of hurting yourself in some way Nearly every day Not at all Not at all  PHQ-9 Total Score 24 12 14        Flowsheet Row ED from 05/25/2023 in [facility] Most recent reading at 05/26/2023  1:20 AM ED from 05/25/2023 in [facility] Most recent reading at 05/25/2023  9:24 PM ED from 05/10/2023 in [facility] Most recent reading at 05/10/2023  3:13 AM  C-SSRS RISK CATEGORY Moderate Risk Moderate Risk Moderate Risk       Screenings    Flowsheet Row Most Recent Value  CIWA-Ar Total 4       Total Time spent with patient: 30 minutes  Musculoskeletal  Strength & Muscle Tone: within normal limits Gait & Station: normal Patient leans: N/A  Psychiatric Specialty Exam  Presentation General Appearance:  Casual  Eye Contact: Good  Speech: Clear and Coherent  Speech Volume: Normal  Handedness: Right   Mood and Affect  Mood: Anxious; Depressed  Affect: Congruent   Thought Process  Thought Processes: Coherent  Descriptions of Associations:Intact  Orientation:Full (Time, Place and Person)  Thought Content:WDL  Diagnosis of Schizophrenia or Schizoaffective disorder in past: No   Hallucinations:Hallucinations: None  Ideas of Reference:None  Suicidal Thoughts:Suicidal Thoughts: No  Homicidal Thoughts:Homicidal  Thoughts: No   Sensorium  Memory: Immediate Fair  Judgment: Poor  Insight: Poor   Executive Functions  Concentration: Fair  Attention Span: Fair  Recall: Good  Fund of Knowledge: Good  Language: Good   Psychomotor Activity  Psychomotor Activity: Psychomotor Activity: Normal   Assets  Assets: Desire for Improvement;  Resilience; Vocational/Educational   Sleep  Sleep: Sleep: Fair Number of Hours of Sleep: 4   Nutritional Assessment (For OBS and FBC admissions only) Has the patient had a weight loss or gain of 10 pounds or more in the last 3 months?: No Has the patient had a decrease in food intake/or appetite?: No Does the patient have dental problems?: No Does the patient have eating habits or behaviors that may be indicators of an eating disorder including binging or inducing vomiting?: No Has the patient recently lost weight without trying?: 0 Has the patient been eating poorly because of a decreased appetite?: 0 Malnutrition Screening Tool Score: 0    Physical Exam HENT:     Head: Normocephalic.     Nose: Nose normal.  Eyes:     Pupils: Pupils are equal, round, and reactive to light.  Cardiovascular:     Rate and Rhythm: Tachycardia present.  Pulmonary:     Effort: Pulmonary effort is normal.  Musculoskeletal:        General: Normal range of motion.     Cervical back: Normal range of motion.  Neurological:     General: No focal deficit present.     Mental Status: He is alert.  Psychiatric:        Mood and Affect: Mood normal.        Behavior: Behavior normal.        Thought Content: Thought content normal.        Judgment: Judgment normal.    Review of Systems  Constitutional: Negative.   HENT: Negative.    Eyes: Negative.   Respiratory: Negative.    Cardiovascular: Negative.   Gastrointestinal: Negative.   Genitourinary: Negative.   Musculoskeletal: Negative.   Skin: Negative.   Neurological: Negative.   Psychiatric/Behavioral:  Positive for depression and substance abuse. The patient is nervous/anxious.     Blood pressure (!) 132/98, pulse 97, SpO2 97%. There is no height or weight on file to calculate BMI.  Past Psychiatric History: alcohol, MDD, polysubstance abuse   Is the patient at risk to self? No  Has the patient been a risk to self in the past 6 months?  Yes .    Has the patient been a risk to self within the distant past? Yes   Is the patient a risk to others? No   Has the patient been a risk to others in the past 6 months? No   Has the patient been a risk to others within the distant past? No   Past Medical History: see chart Family History: unknown  Social History: alcohol,  cocaine  Last Labs:  Admission on 05/25/2023  Component Date Value Ref Range Status   WBC 05/25/2023 8.0  4.0 - 10.5 K/uL Final   RBC 05/25/2023 5.00  4.22 - 5.81 MIL/uL Final   Hemoglobin 05/25/2023 16.1  13.0 - 17.0 g/dL Final   HCT 16/11/9602 45.5  39.0 - 52.0 % Final   MCV 05/25/2023 91.0  80.0 - 100.0 fL Final   MCH 05/25/2023 32.2  26.0 - 34.0 pg Final   MCHC 05/25/2023 35.4  30.0 - 36.0 g/dL Final   RDW 54/10/8117  12.1  11.5 - 15.5 % Final   Platelets 05/25/2023 223  150 - 400 K/uL Final   nRBC 05/25/2023 0.0  0.0 - 0.2 % Final   Neutrophils Relative % 05/25/2023 49  % Final   Neutro Abs 05/25/2023 3.9  1.7 - 7.7 K/uL Final   Lymphocytes Relative 05/25/2023 39  % Final   Lymphs Abs 05/25/2023 3.1  0.7 - 4.0 K/uL Final   Monocytes Relative 05/25/2023 10  % Final   Monocytes Absolute 05/25/2023 0.8  0.1 - 1.0 K/uL Final   Eosinophils Relative 05/25/2023 1  % Final   Eosinophils Absolute 05/25/2023 0.1  0.0 - 0.5 K/uL Final   Basophils Relative 05/25/2023 1  % Final   Basophils Absolute 05/25/2023 0.1  0.0 - 0.1 K/uL Final   Immature Granulocytes 05/25/2023 0  % Final   Abs Immature Granulocytes 05/25/2023 0.01  0.00 - 0.07 K/uL Final   Performed at [facility], [address] [place], [place] [address]   Sodium 05/25/2023 135  135 - 145 mmol/L Final   Potassium 05/25/2023 4.1  3.5 - 5.1 mmol/L Final   Chloride 05/25/2023 100  98 - 111 mmol/L Final   CO2 05/25/2023 23  22 - 32 mmol/L Final   Glucose, Bld 05/25/2023 88  70 - 99 mg/dL Final   Glucose reference range applies only to samples taken after fasting for at least 8 hours.   BUN  05/25/2023 21 (H)  6 - 20 mg/dL Final   Creatinine, Ser 05/25/2023 1.23  0.61 - 1.24 mg/dL Final   Calcium 40/98/1191 9.4  8.9 - 10.3 mg/dL Final   Total Protein 47/82/9562 7.2  6.5 - 8.1 g/dL Final   Albumin 13/09/6576 4.4  3.5 - 5.0 g/dL Final   AST 46/96/2952 25  15 - 41 U/L Final   ALT 05/25/2023 31  0 - 44 U/L Final   Alkaline Phosphatase 05/25/2023 56  38 - 126 U/L Final   Total Bilirubin 05/25/2023 1.2  0.0 - 1.2 mg/dL Final   GFR, Estimated 05/25/2023 >60  >60 mL/min Final   Comment: (NOTE) Calculated using the CKD-EPI Creatinine Equation (2021)    Anion gap 05/25/2023 12  5 - 15 Final   Performed at [facility], [address] [place], [place] [address]   Alcohol, Ethyl (B) 05/25/2023 <10  <10 mg/dL Final   Comment: (NOTE) Lowest detectable limit for serum alcohol is 10 mg/dL.  For medical purposes only. Performed at [facility], [address], [place], [place] [address]    TSH 05/25/2023 2.491  0.350 - 4.500 uIU/mL Final   Comment: Performed by a 3rd Generation assay with a functional sensitivity of <=0.01 uIU/mL. Performed at [facility], [address] [place], [place] [address]    POC Amphetamine UR 05/25/2023 None Detected  NONE DETECTED (Cut Off Level 1000 ng/mL) Final   POC Secobarbital (BAR) 05/25/2023 None Detected  NONE DETECTED (Cut Off Level 300 ng/mL) Final   POC Buprenorphine (BUP) 05/25/2023 None Detected  NONE DETECTED (Cut Off Level 10 ng/mL) Final   POC Oxazepam (BZO) 05/25/2023 None Detected  NONE DETECTED (Cut Off Level 300 ng/mL) Final   POC Cocaine UR 05/25/2023 None Detected  NONE DETECTED (Cut Off Level 300 ng/mL) Final   POC Methamphetamine UR 05/25/2023 None Detected  NONE DETECTED (Cut Off Level 1000 ng/mL) Final   POC Morphine 05/25/2023 None Detected  NONE DETECTED (Cut Off Level 300 ng/mL) Final  POC Methadone UR 05/25/2023 None Detected  NONE DETECTED (Cut Off Level 300 ng/mL) Final   POC Oxycodone UR 05/25/2023 None  Detected  NONE DETECTED (Cut Off Level 100 ng/mL) Final   POC Marijuana UR 05/25/2023 Positive (A)  NONE DETECTED (Cut Off Level 50 ng/mL) Final  Admission on 05/10/2023, Discharged on 05/10/2023  Component Date Value Ref Range Status   WBC 05/10/2023 7.8  4.0 - 10.5 K/uL Final   RBC 05/10/2023 4.91  4.22 - 5.81 MIL/uL Final   Hemoglobin 05/10/2023 16.7  13.0 - 17.0 g/dL Final   HCT 16/11/9602 45.0  39.0 - 52.0 % Final   MCV 05/10/2023 91.6  80.0 - 100.0 fL Final   MCH 05/10/2023 34.0  26.0 - 34.0 pg Final   MCHC 05/10/2023 37.1 (H)  30.0 - 36.0 g/dL Final   RDW 54/10/8117 12.5  11.5 - 15.5 % Final   Platelets 05/10/2023 260  150 - 400 K/uL Final   nRBC 05/10/2023 0.0  0.0 - 0.2 % Final   Neutrophils Relative % 05/10/2023 40  % Final   Neutro Abs 05/10/2023 3.1  1.7 - 7.7 K/uL Final   Lymphocytes Relative 05/10/2023 51  % Final   Lymphs Abs 05/10/2023 4.0  0.7 - 4.0 K/uL Final   Monocytes Relative 05/10/2023 6  % Final   Monocytes Absolute 05/10/2023 0.5  0.1 - 1.0 K/uL Final   Eosinophils Relative 05/10/2023 2  % Final   Eosinophils Absolute 05/10/2023 0.2  0.0 - 0.5 K/uL Final   Basophils Relative 05/10/2023 1  % Final   Basophils Absolute 05/10/2023 0.0  0.0 - 0.1 K/uL Final   Immature Granulocytes 05/10/2023 0  % Final   Abs Immature Granulocytes 05/10/2023 0.02  0.00 - 0.07 K/uL Final   Performed at [facility], [address], [place], [place] [address]   Sodium 05/10/2023 140  135 - 145 mmol/L Final   POST-ULTRACENTRIFUGATION   Potassium 05/10/2023 3.8  3.5 - 5.1 mmol/L Final   POST-ULTRACENTRIFUGATION   Chloride 05/10/2023 104  98 - 111 mmol/L Final   POST-ULTRACENTRIFUGATION   CO2 05/10/2023 17 (L)  22 - 32 mmol/L Final   POST-ULTRACENTRIFUGATION   Glucose, Bld 05/10/2023 90  70 - 99 mg/dL Final   Comment: Glucose reference range applies only to samples taken after fasting for at least 8 hours. POST-ULTRACENTRIFUGATION    BUN 05/10/2023 16  6 - 20 mg/dL Final    POST-ULTRACENTRIFUGATION   Creatinine, Ser 05/10/2023 1.31 (H)  0.61 - 1.24 mg/dL Final   POST-ULTRACENTRIFUGATION   Calcium 05/10/2023 8.8 (L)  8.9 - 10.3 mg/dL Final   POST-ULTRACENTRIFUGATION   Total Protein 05/10/2023 6.6  6.5 - 8.1 g/dL Final   POST-ULTRACENTRIFUGATION   Albumin 05/10/2023 3.9  3.5 - 5.0 g/dL Final   POST-ULTRACENTRIFUGATION   AST 05/10/2023 22  15 - 41 U/L Final   POST-ULTRACENTRIFUGATION   ALT 05/10/2023 26  0 - 44 U/L Final   POST-ULTRACENTRIFUGATION   Alkaline Phosphatase 05/10/2023 46  38 - 126 U/L Final   POST-ULTRACENTRIFUGATION   Total Bilirubin 05/10/2023 0.4  0.0 - 1.2 mg/dL Final   POST-ULTRACENTRIFUGATION   GFR, Estimated 05/10/2023 >60  >60 mL/min Final   Comment: POST-ULTRACENTRIFUGATION (NOTE) Calculated using the CKD-EPI Creatinine Equation (2021)    Anion gap 05/10/2023 19 (H)  5 - 15 Final   Comment: POST-ULTRACENTRIFUGATION Performed at [facility], [address], [place], [place] [address]    Hgb A1c MFr Bld 05/10/2023  5.0  4.8 - 5.6 % Final   Comment: (NOTE) Pre diabetes:          5.7%-6.4%  Diabetes:              >6.4%  Glycemic control for   <7.0% adults with diabetes    Mean Plasma Glucose 05/10/2023 96.8  mg/dL Final   Performed at [facility], [address], [place], [place] [address]   Alcohol, Ethyl (B) 05/10/2023 172 (H)  <10 mg/dL Final   Comment: (NOTE) Lowest detectable limit for serum alcohol is 10 mg/dL.  For medical purposes only. Performed at [facility], [address], [place], [place] [address]    Cholesterol 05/10/2023 186  0 - 200 mg/dL Final   Triglycerides 44/02/270 1,268 (H)  <150 mg/dL Final   RESULT CONFIRMED BY MANUAL DILUTION   HDL 05/10/2023 NOT REPORTED DUE TO HIGH TRIGLYCERIDES  >40 mg/dL Final   Total CHOL/HDL Ratio 05/10/2023 NOT REPORTED DUE TO HIGH TRIGLYCERIDES  RATIO Final   VLDL 05/10/2023 UNABLE TO CALCULATE IF TRIGLYCERIDE OVER 400 mg/dL  0 - 40 mg/dL Final    LDL Cholesterol 05/10/2023 UNABLE TO CALCULATE IF TRIGLYCERIDE OVER 400 mg/dL  0 - 99 mg/dL Final   Comment:        Total Cholesterol/HDL:CHD Risk Coronary Heart Disease Risk Table                     Men   Women  1/2 Average Risk   3.4   3.3  Average Risk       5.0   4.4  2 X Average Risk   9.6   7.1  3 X Average Risk  23.4   11.0        Use the calculated Patient Ratio above and the CHD Risk Table to determine the patient's CHD Risk.        ATP III CLASSIFICATION (LDL):  <100     mg/dL   Optimal  536-644  mg/dL   Near or Above                    Optimal  130-159  mg/dL   Borderline  034-742  mg/dL   High  >595     mg/dL   Very High Performed at [facility], [address] [place], [place] [address]    TSH 05/10/2023 1.280  0.350 - 4.500 uIU/mL Final   Comment: Performed by a 3rd Generation assay with a functional sensitivity of <=0.01 uIU/mL. Performed at [facility], [address], [place], [place] [address]    Opiates 05/10/2023 NONE DETECTED  NONE DETECTED Final   Cocaine 05/10/2023 NONE DETECTED  NONE DETECTED Final   Benzodiazepines 05/10/2023 NONE DETECTED  NONE DETECTED Final   Amphetamines 05/10/2023 NONE DETECTED  NONE DETECTED Final   Tetrahydrocannabinol 05/10/2023 POSITIVE (A)  NONE DETECTED Final   Barbiturates 05/10/2023 NONE DETECTED  NONE DETECTED Final   Comment: (NOTE) DRUG SCREEN FOR MEDICAL PURPOSES ONLY.  IF CONFIRMATION IS NEEDED FOR ANY PURPOSE, NOTIFY LAB WITHIN 5 DAYS.  LOWEST DETECTABLE LIMITS FOR URINE DRUG SCREEN Drug Class                     Cutoff (ng/mL) Amphetamine and metabolites    1000 Barbiturate and metabolites    200 Benzodiazepine  200 Opiates and metabolites        300 Cocaine and metabolites        300 THC                            50 Performed at [facility], [address] [place], [place] [address]    Direct LDL 05/10/2023 91  0 - 99 mg/dL Final   Performed at [facility]  Lab, [address], [place], [place] [address]    Allergies: Patient has no known allergies.  Medications:  Facility Ordered Medications  Medication   acetaminophen (TYLENOL) tablet 650 mg   alum & mag hydroxide-simeth (MAALOX/MYLANTA) 200-200-20 MG/5ML suspension 30 mL   magnesium hydroxide (MILK OF MAGNESIA) suspension 30 mL   OLANZapine zydis (ZYPREXA) disintegrating tablet 5 mg   OLANZapine (ZYPREXA) injection 5 mg   OLANZapine (ZYPREXA) injection 10 mg   PTA Medications  Medication Sig   traZODone (DESYREL) 50 MG tablet Take 50 mg by mouth at bedtime as needed for sleep. (Patient not taking: Reported on 05/10/2023)    Long Term Goals: Improvement in symptoms so as ready for discharge  Short Term Goals: Patient will verbalize feelings in meetings with treatment team members., Patient will attend at least of 50% of the groups daily., Pt will complete the PHQ9 on admission, day 3 and discharge., Patient will participate in completing the [place] Suicide Severity Rating Scale, Patient will score a low risk of violence for 24 hours prior to discharge, and Patient will take medications as prescribed daily.  Medical Decision Making  Inpatient [facility]    Recommendations  Based on my evaluation the patient does not appear to have an emergency medical condition.  [name], NP 05/26/23  5:19 AM
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
## 2023-05-26 ENCOUNTER — Encounter (HOSPITAL_COMMUNITY): Payer: Self-pay | Admitting: Psychiatry

## 2023-05-26 DIAGNOSIS — Z91148 Patient's other noncompliance with medication regimen for other reason: Secondary | ICD-10-CM | POA: Diagnosis not present

## 2023-05-26 DIAGNOSIS — F339 Major depressive disorder, recurrent, unspecified: Secondary | ICD-10-CM | POA: Diagnosis not present

## 2023-05-26 DIAGNOSIS — Z5900 Homelessness unspecified: Secondary | ICD-10-CM | POA: Diagnosis not present

## 2023-05-26 DIAGNOSIS — F1010 Alcohol abuse, uncomplicated: Secondary | ICD-10-CM | POA: Diagnosis not present

## 2023-05-26 MED ORDER — THIAMINE MONONITRATE 100 MG PO TABS
100.0000 mg | ORAL_TABLET | Freq: Every day | ORAL | Status: DC
  Administered 2023-05-27 – 2023-05-28 (×2): 100 mg via ORAL
  Filled 2023-05-26 (×2): qty 1

## 2023-05-26 MED ORDER — CHLORDIAZEPOXIDE HCL 25 MG PO CAPS: 25.0000 mg | ORAL_CAPSULE | ORAL | Status: DC

## 2023-05-26 MED ORDER — TRAZODONE HCL 50 MG PO TABS
50.0000 mg | ORAL_TABLET | Freq: Every evening | ORAL | Status: DC | PRN
  Administered 2023-05-26 – 2023-05-27 (×2): 50 mg via ORAL
  Filled 2023-05-26 (×2): qty 1
  Filled 2023-05-26: qty 14

## 2023-05-26 MED ORDER — LORAZEPAM 1 MG PO TABS: 1.0000 mg | ORAL_TABLET | Freq: Two times a day (BID) | ORAL | Status: DC

## 2023-05-26 MED ORDER — ADULT MULTIVITAMIN W/MINERALS CH
1.0000 | ORAL_TABLET | Freq: Every day | ORAL | Status: DC
  Administered 2023-05-26 – 2023-05-28 (×3): 1 via ORAL
  Filled 2023-05-26 (×3): qty 1

## 2023-05-26 MED ORDER — CHLORDIAZEPOXIDE HCL 25 MG PO CAPS
25.0000 mg | ORAL_CAPSULE | Freq: Four times a day (QID) | ORAL | Status: AC
  Administered 2023-05-26 – 2023-05-27 (×4): 25 mg via ORAL
  Filled 2023-05-26 (×4): qty 1

## 2023-05-26 MED ORDER — LORAZEPAM 1 MG PO TABS: 1.0000 mg | ORAL_TABLET | Freq: Four times a day (QID) | ORAL | Status: DC | PRN

## 2023-05-26 MED ORDER — LORAZEPAM 1 MG PO TABS: 1.0000 mg | ORAL_TABLET | Freq: Three times a day (TID) | ORAL | Status: DC

## 2023-05-26 MED ORDER — ONDANSETRON 4 MG PO TBDP: 4.0000 mg | ORAL_TABLET | Freq: Four times a day (QID) | ORAL | Status: DC | PRN

## 2023-05-26 MED ORDER — SERTRALINE HCL 50 MG PO TABS
50.0000 mg | ORAL_TABLET | Freq: Every day | ORAL | Status: DC
  Administered 2023-05-26 – 2023-05-28 (×3): 50 mg via ORAL
  Filled 2023-05-26 (×2): qty 1
  Filled 2023-05-26: qty 14
  Filled 2023-05-26: qty 1

## 2023-05-26 MED ORDER — CHLORDIAZEPOXIDE HCL 25 MG PO CAPS: 25.0000 mg | ORAL_CAPSULE | Freq: Four times a day (QID) | ORAL | Status: DC | PRN

## 2023-05-26 MED ORDER — CHLORDIAZEPOXIDE HCL 25 MG PO CAPS: 25.0000 mg | ORAL_CAPSULE | Freq: Every day | ORAL | Status: DC

## 2023-05-26 MED ORDER — CHLORDIAZEPOXIDE HCL 25 MG PO CAPS
25.0000 mg | ORAL_CAPSULE | Freq: Three times a day (TID) | ORAL | Status: AC
  Administered 2023-05-27 – 2023-05-28 (×3): 25 mg via ORAL
  Filled 2023-05-26 (×3): qty 1

## 2023-05-26 MED ORDER — NALTREXONE HCL 50 MG PO TABS
25.0000 mg | ORAL_TABLET | Freq: Every day | ORAL | Status: DC
  Administered 2023-05-26: 25 mg via ORAL
  Filled 2023-05-26: qty 1

## 2023-05-26 MED ORDER — LORAZEPAM 1 MG PO TABS: 1.0000 mg | ORAL_TABLET | Freq: Four times a day (QID) | ORAL | Status: DC

## 2023-05-26 MED ORDER — LOPERAMIDE HCL 2 MG PO CAPS: 2.0000 mg | ORAL_CAPSULE | ORAL | Status: DC | PRN

## 2023-05-26 MED ORDER — LORAZEPAM 1 MG PO TABS: 1.0000 mg | ORAL_TABLET | Freq: Every day | ORAL | Status: DC

## 2023-05-26 MED ORDER — HYDROXYZINE HCL 25 MG PO TABS
25.0000 mg | ORAL_TABLET | Freq: Four times a day (QID) | ORAL | Status: DC | PRN
Start: 2023-05-26 — End: 2023-05-29
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
## 2023-05-26 NOTE — ED Notes (Signed)
 Pt in bedroom reading a book and in NAD at this time.
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
## 2023-05-26 NOTE — ED Notes (Signed)
 Patient is resting in bed with eyes closed without any distress noted. He arouses to his name and is calm and cooperative. He denies SIHI, AVH, and reports anxiety 8 and depression 7. He reports eating okay with las BM today. He also reports difficulties sleeping and reports having nightmares. The patient reports withdrawal symptoms of sweating and anxiety. Staff will continue to monitor safety per protocol and for changes in condition.

## 2023-05-26 NOTE — ED Notes (Signed)
 Pt in bedroom reading at this time and in NAD
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
## 2023-05-26 NOTE — ED Notes (Signed)
 Pt asked for clothes MHT went into his locker all his pants had strings on them. I got him some shorts and some underwear.

## 2023-05-26 NOTE — Group Note (Signed)
 Group Topic: Recovery Basics  Group Date: 05/26/2023 Start Time: 1915 End Time: 2000 Facilitators: Rae Lips B  Department: [facility]  Number of Participants: 3  Group Focus: check in, clarity of thought, communication, community group, coping skills, daily focus, and relapse prevention Treatment Modality:  Individual Therapy and Leisure Development Interventions utilized were leisure development, patient education, problem solving, and support Purpose: express feelings and increase insight  Name: Curtis Ross Date of Birth: [birth year]  MR: [id number]    Level of Participation: PT DID NOT ATTEND GROUP Quality of Participation: cooperative Interactions with others: gave feedback Mood/Affect: appropriate Triggers (if applicable): NA Cognition: coherent/clear Progress: None Response: NA Plan: patient will be encouraged to attend groups.   Patients Problems:  Patient Active Problem List   Diagnosis Date Noted   Alcohol abuse 05/25/2023   Major depressive disorder, recurrent episode, moderate (HCC) 01/14/2021   GAD (generalized anxiety disorder) 05/28/2015   Alcohol use disorder, severe, dependence (HCC) 05/26/2015

## 2023-05-26 NOTE — ED Notes (Signed)
 Pt BP IS 132/98. NP Channing Mutters notified.

## 2023-05-26 NOTE — ED Notes (Signed)
 Patient is sleeping. Respirations equal and unlabored, skin warm and dry. No change in assessment or acuity. Routine safety checks conducted according to facility protocol. Will continue to monitor for safety.

## 2023-05-26 NOTE — ED Notes (Signed)
 Patient is resting in bed without any distress noted. No s/s of discomfort. Staff will continue to monitor safety per protocol and for changes in condition.

## 2023-05-26 NOTE — ED Notes (Signed)
 Patient b/p = 136/94 patient is without distress.  MD made aware.  No new orders.  Will monitor.

## 2023-05-26 NOTE — Group Note (Signed)
 Group Topic: Social Support  Group Date: 05/26/2023 Start Time: 1030 End Time: 1100 Facilitators: Evelina Bucy, RN  Department: [facility]  Number of Participants: 4  Group Focus: check in, communication, relapse prevention, relaxation, and social skills Treatment Modality:  Psychoeducation Interventions utilized were group exercise, patient education, and support Purpose: increase insight and relapse prevention strategies  Name: Curtis Ross Date of Birth: [birth year]  MR: [id number]    Level of Participation: active Quality of Participation: attentive, cooperative, initiates communication, and offered feedback Interactions with others: gave feedback Mood/Affect: appropriate Triggers (if applicable):  Cognition: coherent/clear Progress: Gaining insight Response:  Plan: follow-up needed  Patients Problems:  Patient Active Problem List   Diagnosis Date Noted   Alcohol abuse 05/25/2023   Major depressive disorder, recurrent episode, moderate (HCC) 01/14/2021   GAD (generalized anxiety disorder) 05/28/2015   Alcohol use disorder, severe, dependence (HCC) 05/26/2015

## 2023-05-26 NOTE — Tx Team (Signed)
 LCSW, MD, and Resident met with patient to assess current mood, affect, physical state, and inquire about needs/goals while here in [facility] and after discharge. Patient reports he presented due to 15 years of alcohol use and wanting to stop. Patient reports a couple of months of sobriety a few years ago, stating "It was out of sight, out of mind".  Per chart, patient has been drinking 5-6 lokos and beers daily. Patient reported to the team that he drinks at least 5 times a week and about 15 drinks in one setting. Patient reports he has also struggled with suicidal ideation with thoughts of feeling like "the world would be better without him ". Patient reports struggling also with anxiety and depression.  Patient reports he has been homeless in [place] and reports having little family support. Patient reports support from his girlfriend and sister.  Patient reports he has been the primary caregiver for his mom who passed in June 2024.  Patient reports this as a stressor as well as his newborn baby that he just had 5 weeks ago. Patient reports joy around having his child, however reports things have just been stressful for the last 9 months for him.  Patient reports he is currently on probation for assault on a public official back in December 2022.  Patient reports he was released from jail in 10/2022 after serving time for a DUI.  Patient reports he is currently unemployed and does not receive any income.  Patient reports a past history of inpatient rehabilitation for substance use stating his last admission was last year in November at [facility]. Patient reports he recently tried to go to [facility] [place] in [place], however reports he was not pleased with the program and did not feel safe. Patient reports he came to the [facility] in order to seek further help for himself.  Patient reports since Sunday he has been staying in and out of motels and does not have a place to stay at this time.  Patient reports his current  goal is to seek residential placement, and expresses an interest in being referred to [facility].  Patient aware that LCSW will send referral for review.  Updates will be provided once received.  No other needs were reported by the patient at this time.    LCSW received contact information for her probation officer Wilson at [phone].  LCSW attempted to make contact with the patient's PO in order to inform of patient arrival and need for further treatment.  Voicemail was left on a private voicemail box regarding update.  Request for phone call back was also initiated.  No other needs to report at this time.  LCSW will continue to follow and provide support to patient while on [facility] unit.  Fernande Boyden, LCSW Clinical Social Worker [place] [facility] Ph: [phone]

## 2023-05-26 NOTE — Discharge Planning (Signed)
 LCSW received phone call back from [place] in Admissions at [facility]. Per Marcelino Duster, the patient has been approved for admission at [facility]. Marcelino Duster made aware that patient is new to [facility] and still in detox. Update clinicals will be faxed over Thursday with a possible admission on Friday or Monday. Discussion will be held with MD regarding anticipated discharge date. No other needs to report at this time.   Fernande Boyden, LCSW Clinical Social Worker [place] [facility] Ph: [phone]

## 2023-05-27 DIAGNOSIS — F1010 Alcohol abuse, uncomplicated: Secondary | ICD-10-CM | POA: Diagnosis not present

## 2023-05-27 DIAGNOSIS — F339 Major depressive disorder, recurrent, unspecified: Secondary | ICD-10-CM | POA: Diagnosis not present

## 2023-05-27 DIAGNOSIS — Z91148 Patient's other noncompliance with medication regimen for other reason: Secondary | ICD-10-CM | POA: Diagnosis not present

## 2023-05-27 DIAGNOSIS — Z5900 Homelessness unspecified: Secondary | ICD-10-CM | POA: Diagnosis not present

## 2023-05-27 MED ORDER — HYDROXYZINE HCL 25 MG PO TABS
25.0000 mg | ORAL_TABLET | Freq: Four times a day (QID) | ORAL | Status: DC | PRN
  Administered 2023-05-28: 25 mg via ORAL
  Filled 2023-05-27: qty 1

## 2023-05-27 MED ORDER — ENSURE ENLIVE PO LIQD
237.0000 mL | Freq: Two times a day (BID) | ORAL | Status: DC
Start: 2023-05-27 — End: 2023-05-27

## 2023-05-27 MED ORDER — NICOTINE POLACRILEX 2 MG MT GUM
2.0000 mg | CHEWING_GUM | OROMUCOSAL | Status: DC | PRN
  Administered 2023-05-27: 2 mg via ORAL
  Filled 2023-05-27: qty 1

## 2023-05-27 MED ORDER — NALTREXONE HCL 50 MG PO TABS
50.0000 mg | ORAL_TABLET | Freq: Every day | ORAL | Status: DC
  Administered 2023-05-27: 50 mg via ORAL
  Filled 2023-05-27: qty 1
  Filled 2023-05-27: qty 14

## 2023-05-27 NOTE — Group Note (Signed)
 Group Topic: Social Support  Group Date: 05/27/2023 Start Time: 1630 End Time: 1700 Facilitators: Loyce Dys, NT  Department: [facility]  Number of Participants: 6  Group Focus: communication Treatment Modality:  Individual Therapy Interventions utilized were support Purpose: express feelings, improve communication skills, and regain self-worth  Name: REMER COUSE Date of Birth: 02/27/1990  MR: [id number]    Level of Participation: PT did not attend group Quality of Participation: cooperative Interactions with others: gave feedback Mood/Affect: appropriate Triggers (if applicable): n/a Cognition: coherent/clear Progress: None Response: n/a Plan: PT will be encouraged to attend group  Patients Problems:  Patient Active Problem List   Diagnosis Date Noted   Alcohol abuse 05/25/2023   Major depressive disorder, recurrent episode, moderate (HCC) 01/14/2021   GAD (generalized anxiety disorder) 05/28/2015   Alcohol use disorder, severe, dependence (HCC) 05/26/2015

## 2023-05-27 NOTE — Progress Notes (Signed)
Pt's CIWA was 5. °

## 2023-05-27 NOTE — ED Notes (Signed)
 Patient is resting in bed without any distress noted. No s/s of discomfort. Staff will continue to monitor safety per protocol and for changes in condition.

## 2023-05-27 NOTE — Group Note (Signed)
 Group Topic: Substance Abuse Treatment  Group Date: 05/27/2023 Start Time: 0200 End Time: 0300 Facilitators: Loleta Dicker, LCSW  Department: [facility]  Number of Participants: 5  Group Focus: chemical dependency education, chemical dependency issues, and relapse prevention Treatment Modality:  Behavior Modification Therapy Interventions utilized were story telling and support Purpose: enhance coping skills, express feelings, increase insight, relapse prevention strategies, and trigger / craving management  Name: Curtis Ross Date of Birth: May 01, 1990  MR: [id number]    Level of Participation: active Quality of Participation: attentive Response: Patient participated in AA group on today. There were issues reported by the staff conducting the group. Patient has proactive in his treatment and respectable to staff and peers.  Plan: referral / recommendations  Patients Problems:  Patient Active Problem List   Diagnosis Date Noted   Alcohol abuse 05/25/2023   Major depressive disorder, recurrent episode, moderate (HCC) 01/14/2021   GAD (generalized anxiety disorder) 05/28/2015   Alcohol use disorder, severe, dependence (HCC) 05/26/2015

## 2023-05-27 NOTE — Progress Notes (Signed)
Pt is awake, alert and oriented X3. Pt did not voice any complaints of pain or discomfort. No signs of acute distress noted. Administered scheduled meds per order. Pt denies current SI/HI/AVH, plan or intent. Staff will monitor for pt's safety.

## 2023-05-27 NOTE — Discharge Planning (Signed)
 Referral was received and per Marcelino Duster, patient has been accepted and can transfer to the facility on Friday 05/29/2023 by 9:00am. Update has been provided to the patient and MD made aware. Patient will need a 7-14 day supply of medication and one month refill. No nicotine gum allowed, however 14-30 day nicotine patches to be provided if needed. No other needs to report at this time.   LCSW also spoke with PO on yesterday to inform of current plan for residential placement. PO reports no restrictions at this time, however he would just like to be informed when the patient has discharged for [facility]. LCSW will follow up to inform. No other needs to report.    LCSW will continue to follow up and provide updates as received.    Fernande Boyden, LCSW Clinical Social Worker [place] [facility] Ph: [phone]

## 2023-05-27 NOTE — Discharge Instructions (Signed)
 Patient will discharge to [facility]: [address] [place], [place] [address] on (**Friday 05/29/2023**) by 9:00am with transportation provided by [facility]. Number to call is [phone].   [facility] [address], [place], [address] [phone] phone  New Patient Assessment/Therapy Walk-Ins:  Monday and Wednesday: 8 am until slots are full. Every 1st and 2nd Fridays of the month: 1 pm - 5 pm.  NO ASSESSMENT/THERAPY WALK-INS ON TUESDAYS OR THURSDAYS  New Patient Assessment/Medication Management Walk-Ins:  Monday - Friday:  8 am - 11 am.  For all walk-ins, we ask that you arrive by 7:30 am because patients will be seen in the order of arrival.  Availability is limited; therefore, you may not be seen on the same day that you walk-in.  Our goal is to serve and meet the needs of our community to the best of our [place] ability.  SUBSTANCE USE TREATMENT for Medicaid and State Funded/IPRS  [facility] [address], [place], [address] [phone] phone NOTE: [facility] is no longer offering IOP services.  Serves those who are low-income or have no insurance.  [facility] [address] [place], [place], [address] [phone] phone [phone] fax NOTE: Does have Substance Abuse-Intensive Outpatient Program [facility]) as well as transitional housing if eligible.  [facility] [address] [place], [place], [address] [phone] phone [phone] fax  [facility] [phone] [place], [place], [address] [phone] phone [phone] fax  HALFWAY HOUSES:  [facility] [phone]  [email or web address]  12 STEP PROGRAMS:  Alcoholics Anonymous of [place] SoftwareChalet.be  Narcotics Anonymous of [place] [employer]ct.dk  Al-Anon of [place], [place]  www.[place]alanon.org/find-meetings.html  Nar-Anon https://nar-anon.org/find-a-meetin  List of Residential placements:   [facility] in [place]: [phone]  [facility]: [phone]  [place], [place] [phone]: Male and male facility; 30-day program: (uninsured and Medicaid such as Laurena Bering, [place], [place], partners)  [facility]: [phone]; men and women's facility; 28 days; Can have Medicaid tailored plan Tour manager or Partners)  [facility]: [phone] Karoline Caldwell or Larita Fife; 28 day program; must be fully detox; tailored Medicaid or no insurance  [address] in [place], [place]; [phone]; 28 day all males program; no insurance accepted  [facility] in [place]: Gabriel Rung [phone] (no insurance or Medicaid only); 90 days; outpatient services but provide housing in apartments downtown [place]  RTS Admission: [phone]: Patient must complete phone screening for placement: [place], [place]; 6 month program; uninsured, Medicaid, and [employer].   [facility]: no insurance required; [phone]  [facility]: [phone]; Intake: Molly Maduro; Must fill out application online; Alecia Lemming Delay [phone] x [address] Mission in [place], [place]: [phone]; Admissions Coordinators Mr. Maurine Minister or Barron Alvine; 90 day program.  [facility]: [place], [place] [phone]; Co-Ed 9 month to a year program; Online application; Men entry fee is $500 (6-65months);  [employer]: [address] [place], [place] [address]; no fee or insurance required; minimum of 2 years; Highly structured; work based; Intake Coordinator is Thayer Ohm [phone]  [facility] in [place], [place]: [phone]; Fax number is [phone]; website: [email or web address].[facility].org; Requires 3-6 page autobiography; 2 year program (18 months and then 55month transitional housing);  Admission fee is $300; no insurance needed; work Automotive engineer in [place], [place]: [employer] Desk Staff: Danise Edge [phone]: They have a Men's Regenerations Program 6-27months. Free program; There is an initial $300 fee however,  they are willing to work with patients regarding that. Application is online.  [facility]: Admissions [phone] Shawnte Demarest ext [phone]; Any 7-90 day program is out of pocket; 12 month program is free of charge; there is a $275 entry fee; Patient is responsible for own transportation
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
## 2023-05-27 NOTE — Progress Notes (Signed)
 Pt self isolated in his room but came out for meals. No distress noted or concerns voiced. Staff will monitor for pt's safety.

## 2023-05-27 NOTE — Group Note (Signed)
 Group Topic: Recovery Basics  Group Date: 05/27/2023 Start Time: 2015 End Time: 2100 Facilitators: Rae Lips B  Department: [facility]  Number of Participants: 5  Group Focus: abuse issues, acceptance, activities of daily living skills, clarity of thought, communication, community group, daily focus, depression, and relapse prevention Treatment Modality:  Individual Therapy Interventions utilized were leisure development, problem solving, story telling, and support Purpose: enhance coping skills, express feelings, increase insight, regain self-worth, reinforce self-care, and relapse prevention strategies  Name: Curtis Ross Date of Birth: 15-Jun-1990  MR: [id number]    Level of Participation: active Quality of Participation: attentive, cooperative, initiates communication, motivated, and offered feedback Interactions with others: gave feedback Mood/Affect: appropriate and positive Triggers (if applicable): NA Cognition: coherent/clear Progress: Gaining insight Response: NA Plan: patient will be encouraged to go to groups.   Patients Problems:  Patient Active Problem List   Diagnosis Date Noted   Alcohol abuse 05/25/2023   Major depressive disorder, recurrent episode, moderate (HCC) 01/14/2021   GAD (generalized anxiety disorder) 05/28/2015   Alcohol use disorder, severe, dependence (HCC) 05/26/2015

## 2023-05-28 DIAGNOSIS — F1010 Alcohol abuse, uncomplicated: Secondary | ICD-10-CM | POA: Diagnosis not present

## 2023-05-28 DIAGNOSIS — Z91148 Patient's other noncompliance with medication regimen for other reason: Secondary | ICD-10-CM | POA: Diagnosis not present

## 2023-05-28 DIAGNOSIS — Z5900 Homelessness unspecified: Secondary | ICD-10-CM | POA: Diagnosis not present

## 2023-05-28 DIAGNOSIS — F339 Major depressive disorder, recurrent, unspecified: Secondary | ICD-10-CM | POA: Diagnosis not present

## 2023-05-28 LAB — SARS CORONAVIRUS 2 BY RT PCR: SARS Coronavirus 2 by RT PCR: NEGATIVE

## 2023-05-28 MED ORDER — SERTRALINE HCL 50 MG PO TABS: 50.0000 mg | ORAL_TABLET | Freq: Every day | ORAL | 0 refills | Status: AC

## 2023-05-28 MED ORDER — NICOTINE 21 MG/24HR TD PT24
21.0000 mg | MEDICATED_PATCH | Freq: Every day | TRANSDERMAL | Status: DC
  Administered 2023-05-28: 21 mg via TRANSDERMAL
  Filled 2023-05-28: qty 14

## 2023-05-28 MED ORDER — NICOTINE 14 MG/24HR TD PT24
14.0000 mg | MEDICATED_PATCH | Freq: Every day | TRANSDERMAL | Status: DC
  Administered 2023-05-28: 14 mg via TRANSDERMAL
  Filled 2023-05-28: qty 1

## 2023-05-28 MED ORDER — TRAZODONE HCL 50 MG PO TABS: 50.0000 mg | ORAL_TABLET | Freq: Every evening | ORAL | 0 refills | Status: AC | PRN

## 2023-05-28 MED ORDER — NICOTINE 21 MG/24HR TD PT24
MEDICATED_PATCH | TRANSDERMAL | Status: AC
  Filled 2023-05-28: qty 1

## 2023-05-28 MED ORDER — NALTREXONE HCL 50 MG PO TABS: 50.0000 mg | ORAL_TABLET | Freq: Every day | ORAL | 0 refills | Status: AC

## 2023-05-28 MED ORDER — NICOTINE 21 MG/24HR TD PT24: 21.0000 mg | MEDICATED_PATCH | Freq: Every day | TRANSDERMAL | 0 refills | Status: AC

## 2023-05-28 NOTE — ED Notes (Signed)
 Patient sitting in courtyard interacting with peers and participating in group. No acute distress noted. No concerns voiced. Informed patient to notify staff with any needs or assistance. Patient verbalized understanding or agreement. Safety checks in place per facility policy.
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
## 2023-05-28 NOTE — Group Note (Signed)
 Group Topic: Social Support  Group Date: 05/28/2023 Start Time: 1720 End Time: 1730 Facilitators: Kyrstyn Greear, Jacklynn Barnacle, RN  Department: [facility]  Number of Participants: 1  Group Focus: check in Treatment Modality:  Individual Therapy Interventions utilized were support Purpose: express feelings  Name: Curtis Ross Date of Birth: [birth year]  MR: [id number]    Level of Participation: moderate Quality of Participation: attentive and cooperative Interactions with others: gave feedback Mood/Affect: appropriate Triggers (if applicable): None identified Cognition: coherent/clear Progress: Gaining insight Response: Patient reports anxiety but otherwise is doing well. Patient planning to discharge home later this evening and then go to [facility] in the AM, no questions regarding plan at this time. Plan: patient will be encouraged to reach out to support system as needs arise, continue with rehabilitation process at [facility] tomorrow post discharge  Patients Problems:  Patient Active Problem List   Diagnosis Date Noted   Alcohol abuse 05/25/2023   Major depressive disorder, recurrent episode, moderate (HCC) 01/14/2021   GAD (generalized anxiety disorder) 05/28/2015   Alcohol use disorder, severe, dependence (HCC) 05/26/2015

## 2023-05-28 NOTE — Discharge Planning (Signed)
 LCSW received phone call back from the patient's Father Johna Sheriff [phone] confirming that he will pick the patient up on today around 6-6:30pm and will take him to [facility] by 7:00am on tomorrow. Father reports no safety concerns for the patient returning home with him. No other needs were reported by the father at this time. Team made aware. LCSW to sign off at this time. Please inform if further LCSW needs arise prior to discharge.   Fernande Boyden, LCSW Clinical Social Worker [facility] Ph: [phone]

## 2023-05-28 NOTE — ED Notes (Signed)
 Patient discharged home per MD order. After Visit Summary (AVS) printed and given to patient, as well as printed prescriptions. AVS reviewed with patient and all questions fully answered. Patient discharged in no acute distress, A& O x4 and ambulatory. Patient denied SI/HI, A/VH upon discharge. Patient verbalized understanding of all discharge instructions explained by staff, including follow up appointments, RX's and safety plan. Patient instructed to go to [facility] tomorrow morning as planned. Patient mood fair. Patient belongings returned to patient from locker # 15 complete and intact. Patient escorted to lobby via staff for transport to destination. Safety maintained.

## 2023-05-28 NOTE — ED Notes (Signed)
 PRN Atarax given due to patient reports of anxiety. Medication administered with no complications. Environment secured, safety checks in place per facility policy.

## 2023-05-28 NOTE — Group Note (Signed)
 Group Topic: Social Support  Group Date: 05/28/2023 Start Time: 1500 End Time: 1535 Facilitators: Loyce Dys, NT  Department: [facility]  Number of Participants: 4  Group Focus: goals/reality orientation, healthy friendships, and support Treatment Modality:  Interpersonal Therapy Interventions utilized were support Purpose: express feelings, relapse prevention strategies, and trigger / craving management  Name: DMANI MIZER Date of Birth: Aug 28, 1990  MR: [id number]    Level of Participation: active Quality of Participation: attentive and cooperative Interactions with others: gave feedback Mood/Affect: appropriate and positive Triggers (if applicable): n/a Cognition: coherent/clear and goal directed Progress: Gaining insight Response: PT shared with group his passion and wanting to be there for his newborn baby. PT also stated that he was grateful to wake up this morning. Plan: patient will be encouraged to attend future group sessions.  Patients Problems:  Patient Active Problem List   Diagnosis Date Noted   Alcohol abuse 05/25/2023   Major depressive disorder, recurrent episode, moderate (HCC) 01/14/2021   GAD (generalized anxiety disorder) 05/28/2015   Alcohol use disorder, severe, dependence (HCC) 05/26/2015

## 2023-05-28 NOTE — Group Note (Signed)
 Group Topic: Understanding Self  Group Date: 05/28/2023 Start Time: 0120 End Time: 0150 Facilitators: Loleta Dicker, LCSW  Department: [facility]  Number of Participants: 4  Group Focus: feeling awareness/expression Treatment Modality:  Cognitive Behavioral Therapy Interventions utilized were story telling and support Purpose: increase insight, regain self-worth, and reinforce self-care  Name: Curtis Ross Date of Birth: 1990/10/31  MR: [id number]    Level of Participation: active Quality of Participation: attentive and cooperative Interactions with others: gave feedback Mood/Affect: appropriate Triggers (if applicable): N/A Cognition: coherent/clear Progress: Gaining insight Response: Patient participated in group on today. Participants expressed understanding of group rules. Participants discussed the importance having humility and being considerate of other people. Participants spoke about the things that want to be aware of moving forward when making decisions about their life. Patient reports he wants to learn how to open up more to others so that he can receive the help he needs. Patient reports he has a huge problem with pride. Brief supportive counseling was provided to the patient and he was receptive to the feedback provided.  Plan: referral / recommendations  Patients Problems:  Patient Active Problem List   Diagnosis Date Noted   Alcohol abuse 05/25/2023   Major depressive disorder, recurrent episode, moderate (HCC) 01/14/2021   GAD (generalized anxiety disorder) 05/28/2015   Alcohol use disorder, severe, dependence (HCC) 05/26/2015

## 2023-05-28 NOTE — ED Notes (Signed)
 Patient alert & oriented x4. Denies intent to harm self or others when asked. Denies A/VH. Patient reports nicotine cravings. 14 MG patch ordered per standing orders. Patient requested nicorette as well and Clinical research associate asked providers regarding this request. Provider Oda Cogan, MD discontinued nicorette and increased patch dosage to 21 mg. 14 MG patch removed and new 21 MG patch applied. No acute distress noted. Scheduled medications administered with no complications. Support and encouragement provided. Routine safety checks conducted per facility protocol. Encouraged patient to notify staff if any thoughts of harm towards self or others arise. Patient verbalizes understanding and agreement.

## 2023-05-28 NOTE — ED Notes (Signed)
 Patient resting with eyes closed in no apparent acute distress. Respirations even and unlabored. Environment secured. Safety checks in place according to facility policy.

## 2023-05-28 NOTE — ED Notes (Signed)
 Patient is resting in bed without and distress or discomfort noted or withdrawal symptoms. Staff will continue to monitor safety per protocol and for changes in his condition.

## 2023-05-28 NOTE — Group Note (Signed)
 Group Topic: Recovery Basics  Group Date: 05/28/2023 Start Time: 1800 End Time: 1808 Facilitators: Ajeet Casasola, Jacklynn Barnacle, RN  Department: [facility]  Number of Participants: 1  Group Focus: discharge education Treatment Modality:  Individual Therapy Interventions utilized were patient education Purpose: increase insight  Name: Curtis Ross Date of Birth: 06/15/1990  MR: [id number]    Level of Participation: active Quality of Participation: attentive and cooperative Interactions with others: gave feedback Mood/Affect: appropriate Triggers (if applicable): None identified Cognition: coherent/clear and goal directed Progress: Significant Response: Patient voiced understanding of all discharge instructions as given. AVS reviewed as well as, RX's, samples, and suicide safety plan. Plan: patient will be encouraged to attend [facility] tomorrow as planned  Patients Problems:  Patient Active Problem List   Diagnosis Date Noted   Alcohol abuse 05/25/2023   Major depressive disorder, recurrent episode, moderate (HCC) 01/14/2021   GAD (generalized anxiety disorder) 05/28/2015   Alcohol use disorder, severe, dependence (HCC) 05/26/2015

## 2023-05-28 NOTE — ED Notes (Signed)
 Patient is resting in bed with eyes closed without any distress or s/s of discomfort. Staff will continue to monitor safety per protocol and for changes in his condition.

## 2023-05-28 NOTE — ED Notes (Signed)
 Downtime was from 1am-3am. MHT charted rounds on paper and will put it in their folders.

## 2023-05-28 NOTE — ED Notes (Signed)
 Patient in hallway browsing books, calm and collected. No acute distress noted. No concerns voiced. Informed patient to notify staff with any needs or assistance. Patient verbalized understanding or agreement. Safety checks in place per facility policy.

## 2023-05-28 NOTE — Discharge Planning (Signed)
 Updated clinicals has been faxed over to [facility] for review.  Patient is expected to admit into their facility on tomorrow May 29, 2023 by 7:00 AM.  Patient reports his father will transport him to the facility, and he is aware that he would need to be there at stated time in order to be admitted.  Patient reported appreciation for LCSW assistance with seeking placement for him.  No other needs were reported at this time.  LCSW will continue to follow and provide support to patient while on [facility] unit.  Fernande Boyden, LCSW Clinical Social Worker [facility] Ph: [phone]

## 2023-05-28 NOTE — ED Notes (Signed)
 Patient was visible in the day area sitting watching TV and interacting with peers. He is calm and cooperative without any distress noted. He reports anxiety 8 and depression 7 and denies SIHI and AVH. Affect is sad/flat and reports not having a good day. He reports eating okay with last BM today. The patient reports sleeping on and off and reports having nightmares. He reports withdrawal symptoms of sweating. Staff will continue to monitor safety per protocol and for changes in his condition.
